# Patient Record
Sex: Female | Born: 1980 | Race: White | Hispanic: No | Marital: Married | Smoking: Current every day smoker
Health system: Southern US, Community
[De-identification: ages and names within clinical notes are randomized; demographics above are authoritative.]

## PROBLEM LIST (undated history)

## (undated) DIAGNOSIS — F32A Depression, unspecified: Secondary | ICD-10-CM

## (undated) DIAGNOSIS — T4145XA Adverse effect of unspecified anesthetic, initial encounter: Secondary | ICD-10-CM

## (undated) DIAGNOSIS — R011 Cardiac murmur, unspecified: Secondary | ICD-10-CM

## (undated) DIAGNOSIS — F329 Major depressive disorder, single episode, unspecified: Secondary | ICD-10-CM

## (undated) DIAGNOSIS — F419 Anxiety disorder, unspecified: Secondary | ICD-10-CM

## (undated) DIAGNOSIS — T8859XA Other complications of anesthesia, initial encounter: Secondary | ICD-10-CM

## (undated) HISTORY — PX: TUBAL LIGATION: SHX77

---

## 2003-02-06 ENCOUNTER — Other Ambulatory Visit: Admission: RE | Admit: 2003-02-06 | Discharge: 2003-02-06 | Payer: Self-pay | Admitting: Family Medicine

## 2003-10-01 ENCOUNTER — Emergency Department (HOSPITAL_COMMUNITY): Admission: EM | Admit: 2003-10-01 | Discharge: 2003-10-02 | Payer: Self-pay | Admitting: Emergency Medicine

## 2010-06-03 ENCOUNTER — Emergency Department (HOSPITAL_COMMUNITY): Payer: Self-pay

## 2010-06-03 ENCOUNTER — Emergency Department (HOSPITAL_COMMUNITY)
Admission: EM | Admit: 2010-06-03 | Discharge: 2010-06-03 | Disposition: A | Discharge status: Home / Self Care | Payer: Self-pay | Attending: Emergency Medicine | Admitting: Emergency Medicine

## 2010-06-03 DIAGNOSIS — R079 Chest pain, unspecified: Secondary | ICD-10-CM | POA: Insufficient documentation

## 2010-06-03 DIAGNOSIS — F411 Generalized anxiety disorder: Secondary | ICD-10-CM | POA: Insufficient documentation

## 2011-12-04 ENCOUNTER — Encounter (HOSPITAL_COMMUNITY): Payer: Self-pay | Admitting: *Deleted

## 2011-12-04 ENCOUNTER — Emergency Department (HOSPITAL_COMMUNITY)
Admission: EM | Admit: 2011-12-04 | Discharge: 2011-12-04 | Disposition: A | Discharge status: Home / Self Care | Payer: Medicaid Other | Attending: Emergency Medicine | Admitting: Emergency Medicine

## 2011-12-04 DIAGNOSIS — Z3202 Encounter for pregnancy test, result negative: Secondary | ICD-10-CM | POA: Insufficient documentation

## 2011-12-04 DIAGNOSIS — R112 Nausea with vomiting, unspecified: Secondary | ICD-10-CM | POA: Insufficient documentation

## 2011-12-04 DIAGNOSIS — Z8659 Personal history of other mental and behavioral disorders: Secondary | ICD-10-CM | POA: Insufficient documentation

## 2011-12-04 DIAGNOSIS — F17200 Nicotine dependence, unspecified, uncomplicated: Secondary | ICD-10-CM | POA: Insufficient documentation

## 2011-12-04 HISTORY — DX: Anxiety disorder, unspecified: F41.9

## 2011-12-04 HISTORY — DX: Major depressive disorder, single episode, unspecified: F32.9

## 2011-12-04 HISTORY — DX: Depression, unspecified: F32.A

## 2011-12-04 LAB — COMPREHENSIVE METABOLIC PANEL
ALT: 11 U/L (ref 0–35)
AST: 13 U/L (ref 0–37)
Albumin: 3.7 g/dL (ref 3.5–5.2)
Alkaline Phosphatase: 52 U/L (ref 39–117)
BUN: 4 mg/dL — ABNORMAL LOW (ref 6–23)
Chloride: 106 mEq/L (ref 96–112)
Creatinine, Ser: 0.5 mg/dL (ref 0.50–1.10)
GFR calc non Af Amer: >90 mL/min (ref >90)
Glucose, Bld: 93 mg/dL (ref 70–99)
Potassium: 3.8 mEq/L (ref 3.5–5.1)
Total Bilirubin: 0.1 mg/dL — ABNORMAL LOW (ref 0.3–1.2)
Total Protein: 6.7 g/dL (ref 6.0–8.3)

## 2011-12-04 LAB — GASTRIC OCCULT BLOOD (1-CARD TO LAB): pH, Gastric: 7

## 2011-12-04 LAB — URINALYSIS, ROUTINE W REFLEX MICROSCOPIC
Glucose, UA: NEGATIVE mg/dL
Ketones, ur: NEGATIVE mg/dL

## 2011-12-04 LAB — CBC WITH DIFFERENTIAL/PLATELET
Eosinophils Absolute: 0.1 10*3/uL (ref 0.0–0.7)
Hemoglobin: 13.5 g/dL (ref 12.0–15.0)
MCH: 31.1 pg (ref 26.0–34.0)
Monocytes Relative: 6 % (ref 3–12)
Neutrophils Relative %: 75 % (ref 43–77)
RDW: 13.6 % (ref 11.5–15.5)
WBC: 12.2 10*3/uL — ABNORMAL HIGH (ref 4.0–10.5)

## 2011-12-04 MED ORDER — HYDROCODONE-ACETAMINOPHEN 5-500 MG PO TABS: 1.0000 | ORAL_TABLET | Freq: Four times a day (QID) | ORAL | Status: DC | PRN

## 2011-12-04 MED ORDER — ONDANSETRON HCL 4 MG PO TABS: 4.0000 mg | ORAL_TABLET | Freq: Four times a day (QID) | ORAL | Status: DC

## 2011-12-04 MED ORDER — PANTOPRAZOLE SODIUM 40 MG IV SOLR
40.0000 mg | Freq: Once | INTRAVENOUS | Status: AC
  Administered 2011-12-04: 40 mg via INTRAVENOUS
  Filled 2011-12-04: qty 40

## 2011-12-04 MED ORDER — ONDANSETRON HCL 4 MG/2ML IJ SOLN
4.0000 mg | Freq: Once | INTRAMUSCULAR | Status: AC
  Administered 2011-12-04: 4 mg via INTRAVENOUS
  Filled 2011-12-04: qty 2

## 2011-12-04 MED ORDER — METOCLOPRAMIDE HCL 5 MG/ML IJ SOLN
10.0000 mg | Freq: Once | INTRAMUSCULAR | Status: AC
  Administered 2011-12-04: 10 mg via INTRAVENOUS
  Filled 2011-12-04: qty 2

## 2011-12-04 NOTE — ED Notes (Signed)
Pt has been feeling sick since Saturday night.  Pt reports epigastric pian and that she had dark, coffee ground emesis at that time.  Pt reports that she felt fine on Sunday.  This am she had one episode of dark coffee ground emesis this am.  Pt also had one episode of vomiting en route for ems, does not appear coffee ground looking to me.  Pt reports epigastric pain with this.  EKG wnl for ems today.

## 2011-12-04 NOTE — ED Provider Notes (Signed)
History     CSN: [id number]  Arrival date & time 12/04/11  1040   First MD Initiated Contact with Patient 12/04/11 1048      Chief Complaint  Patient presents with  . Hematemesis    (Consider location/radiation/quality/duration/timing/severity/associated sxs/prior treatment) HPI  Pt comes to the ER for evaluation of nausea and epigastric pain since Saturday night. She feels that she is having coffee ground emesis. She felt no symptoms on Sunday and then this morning she had another episode of vomiting as well as on the way to the ER. She denies having a history of gastric ulcers, excessive alcohol use, pancreatitis, GERD, or cardiac history. She is otherwise healthy, aside from anxiety symptoms. She is not actively vomiting at this time. Her most concerning symptom is the nausea. nad vss  Past Medical History  Diagnosis Date  . Depression   . Anxiety     Past Surgical History  Procedure Date  . Tubal ligation     No family history on file.  History  Substance Use Topics  . Smoking status: Current Every Day Smoker -- 0.5 packs/day    Types: Cigarettes  . Smokeless tobacco: Not on file  . Alcohol Use: Yes    OB History    Grav Para Term Preterm Abortions TAB SAB Ect Mult Living                  Review of Systems    Review of Systems  Gen: no weight loss, fevers, chills, night sweats  Neck: no neck pain  Lungs:No wheezing, coughing or hemoptysis CV: no chest pain, palpitations, dependent edema or orthopnea  Abd: + abdominal pain - epigastric, +nausea, + vomiting  GU: no dysuria or gross hematuria  MSK:  No abnormalities  Neuro: no headache, no focal neurologic deficits  Skin: no abnormalities Psyche: negative.   Allergies  Paxil  Home Medications   Current Outpatient Rx  Name  Route  Sig  Dispense  Refill  . IBUPROFEN 200 MG PO TABS   Oral   Take 600 mg by mouth every 6 (six) hours as needed. For pain         . HYDROCODONE-ACETAMINOPHEN 5-500  MG PO TABS   Oral   Take 1-2 tablets by mouth every 6 (six) hours as needed for pain.   15 tablet   0   . ONDANSETRON HCL 4 MG PO TABS   Oral   Take 1 tablet (4 mg total) by mouth every 6 (six) hours.   12 tablet   0     BP 94/66  Pulse 67  Temp 98.1 F (36.7 C) (Oral)  Resp 16  SpO2 100%  LMP 11/27/2011  Physical Exam  Nursing note and vitals reviewed. Constitutional: She appears well-developed and well-nourished. No distress.  HENT:  Head: Normocephalic and atraumatic.  Eyes: Pupils are equal, round, and reactive to light.  Neck: Normal range of motion. Neck supple.  Cardiovascular: Normal rate and regular rhythm.   Pulmonary/Chest: Effort normal. No respiratory distress. She has no wheezes. She has no rales.  Abdominal: Soft. She exhibits no distension. There is tenderness (mild epigastric tenderness). There is no rebound and no guarding.  Neurological: She is alert.  Skin: Skin is warm and dry.    ED Course  Procedures (including critical care time)  Labs Reviewed  COMPREHENSIVE METABOLIC PANEL - Abnormal; Notable for the following:    BUN 4 (*)     Total Bilirubin 0.1 (*)  All other components within normal limits  CBC WITH DIFFERENTIAL - Abnormal; Notable for the following:    WBC 12.2 (*)     Neutro Abs 9.2 (*)     All other components within normal limits  URINALYSIS, ROUTINE W REFLEX MICROSCOPIC - Abnormal; Notable for the following:    APPearance CLOUDY (*)     Bilirubin Urine SMALL (*)     All other components within normal limits  LIPASE, BLOOD  POCT PREGNANCY, URINE  POCT GASTRIC OCCULT BLOOD   No results found.   1. Nausea and vomiting       MDM   Date: 12/04/2011  Rate: 66  Rhythm: normal sinus rhythm  QRS Axis: normal  Intervals: normal  ST/T Wave abnormalities: normal  Conduction Disutrbances:none  Narrative Interpretation:   Old EKG Reviewed: unchanged  Gastric hemoccult  negative. Pregnancy test negative. Labs are  reassuring.   Pt given Zofran and Reglan. She has had complete resolution of nausea and cramping. Epigastric cramping is only present right before  Vomiting. Will rx zofran and vicodin. Gastro referral given.   Pt has been advised of the symptoms that warrant their return to the ED. Patient has voiced understanding and has agreed to follow-up with the PCP or specialist.          Dorthula Matas, PA 12/04/11 1238

## 2011-12-04 NOTE — ED Provider Notes (Signed)
Medical screening examination/treatment/procedure(s) were performed by non-physician practitioner and as supervising physician I was immediately available for consultation/collaboration.   Gwyneth Sprout, MD 12/04/11 [phone]

## 2011-12-04 NOTE — ED Notes (Signed)
ONG:[id number]<WU> Expected date:<BR> Expected time:<BR> Means of arrival:<BR> Comments:<BR> Vomiting blood

## 2011-12-15 ENCOUNTER — Encounter (HOSPITAL_BASED_OUTPATIENT_CLINIC_OR_DEPARTMENT_OTHER): Payer: Self-pay

## 2011-12-15 ENCOUNTER — Emergency Department (HOSPITAL_BASED_OUTPATIENT_CLINIC_OR_DEPARTMENT_OTHER)
Admission: EM | Admit: 2011-12-15 | Discharge: 2011-12-15 | Disposition: A | Discharge status: Home / Self Care | Payer: Medicaid Other | Attending: Emergency Medicine | Admitting: Emergency Medicine

## 2011-12-15 DIAGNOSIS — K2970 Gastritis, unspecified, without bleeding: Secondary | ICD-10-CM | POA: Insufficient documentation

## 2011-12-15 DIAGNOSIS — K2990 Gastroduodenitis, unspecified, without bleeding: Secondary | ICD-10-CM | POA: Insufficient documentation

## 2011-12-15 DIAGNOSIS — R079 Chest pain, unspecified: Secondary | ICD-10-CM | POA: Insufficient documentation

## 2011-12-15 DIAGNOSIS — Z8659 Personal history of other mental and behavioral disorders: Secondary | ICD-10-CM | POA: Insufficient documentation

## 2011-12-15 DIAGNOSIS — Z3202 Encounter for pregnancy test, result negative: Secondary | ICD-10-CM | POA: Insufficient documentation

## 2011-12-15 DIAGNOSIS — R630 Anorexia: Secondary | ICD-10-CM | POA: Insufficient documentation

## 2011-12-15 DIAGNOSIS — R112 Nausea with vomiting, unspecified: Secondary | ICD-10-CM | POA: Insufficient documentation

## 2011-12-15 DIAGNOSIS — R5381 Other malaise: Secondary | ICD-10-CM | POA: Insufficient documentation

## 2011-12-15 DIAGNOSIS — F17200 Nicotine dependence, unspecified, uncomplicated: Secondary | ICD-10-CM | POA: Insufficient documentation

## 2011-12-15 LAB — COMPREHENSIVE METABOLIC PANEL
ALT: 16 U/L (ref 0–35)
AST: 15 U/L (ref 0–37)
Albumin: 3.9 g/dL (ref 3.5–5.2)
CO2: 23 mEq/L (ref 19–32)
Calcium: 9.4 mg/dL (ref 8.4–10.5)
Creatinine, Ser: 0.6 mg/dL (ref 0.50–1.10)
GFR calc non Af Amer: >90 mL/min (ref >90)
Sodium: 139 mEq/L (ref 135–145)

## 2011-12-15 LAB — URINALYSIS, ROUTINE W REFLEX MICROSCOPIC
Bilirubin Urine: NEGATIVE
Glucose, UA: NEGATIVE mg/dL
Hgb urine dipstick: NEGATIVE
Nitrite: NEGATIVE
Protein, ur: NEGATIVE mg/dL
Specific Gravity, Urine: 1.027 (ref 1.005–1.030)
Urobilinogen, UA: 0.2 mg/dL (ref 0.0–1.0)
pH: 5 (ref 5.0–8.0)

## 2011-12-15 LAB — CBC
MCH: 31 pg (ref 26.0–34.0)
MCV: 87.8 fL (ref 78.0–100.0)
Platelets: 290 10*3/uL (ref 150–400)
RBC: 4.42 MIL/uL (ref 3.87–5.11)
RDW: 13.2 % (ref 11.5–15.5)
WBC: 12.6 10*3/uL — ABNORMAL HIGH (ref 4.0–10.5)

## 2011-12-15 LAB — RAPID URINE DRUG SCREEN, HOSP PERFORMED
Amphetamines: NOT DETECTED
Barbiturates: NOT DETECTED
Benzodiazepines: POSITIVE — AB
Cocaine: NOT DETECTED
Opiates: NOT DETECTED
Tetrahydrocannabinol: POSITIVE — AB

## 2011-12-15 MED ORDER — OMEPRAZOLE 20 MG PO CPDR: 20.0000 mg | DELAYED_RELEASE_CAPSULE | Freq: Two times a day (BID) | ORAL | Status: DC

## 2011-12-15 MED ORDER — SUCRALFATE 1 GM/10ML PO SUSP: 1.0000 g | Freq: Four times a day (QID) | ORAL | Status: DC

## 2011-12-15 MED ORDER — SODIUM CHLORIDE 0.9 % IV BOLUS (SEPSIS)
1000.0000 mL | Freq: Once | INTRAVENOUS | Status: AC
  Administered 2011-12-15: 1000 mL via INTRAVENOUS

## 2011-12-15 MED ORDER — METOCLOPRAMIDE HCL 5 MG/ML IJ SOLN
10.0000 mg | Freq: Once | INTRAMUSCULAR | Status: AC
  Administered 2011-12-15: 10 mg via INTRAVENOUS
  Filled 2011-12-15: qty 2

## 2011-12-15 NOTE — ED Notes (Signed)
Patient reports that she developed chestpain 2 weeks ago with vomiting intermittently and ongoing epigastric pain. Was seen at [facility] ED for same and no diagnosis made. Reports diarrhea intermittently for months as well.

## 2011-12-15 NOTE — ED Notes (Signed)
Pt reminded of need for urine sample. Pt sts she cannot go at this time.

## 2011-12-15 NOTE — ED Provider Notes (Signed)
History     CSN: [id number]  Arrival date & time 12/15/11  0102   First MD Initiated Contact with Patient 12/15/11 0115      Chief Complaint  Patient presents with  . Abdominal Pain    (Consider location/radiation/quality/duration/timing/severity/associated sxs/prior treatment) HPI Comments: Cynthia Russell presents for evaluation of abdominal and chest pain.  Cynthia Russell was seen in the ER on 11/18 with similar discomfort.  Cynthia Russell reports the symptoms improved for about 2 days but have returned.  Cynthia Russell states Cynthia Russell has intermittent mid and upper abdominal pain associated with nonbloody, nonbilious emesis.  Cynthia Russell denies any fever, ST, neck stiffness, SOB, cough, palpitations, melena, and hematochezia.  Cynthia Russell denies trauma also.  Patient is a 31 y.o. female presenting with abdominal pain. The history is provided by the patient.  Abdominal Pain The primary symptoms of the illness include abdominal pain, fatigue, nausea and vomiting. The primary symptoms of the illness do not include fever, shortness of breath, diarrhea, hematemesis, hematochezia, dysuria or vaginal discharge. The current episode started more than 2 days ago. The onset of the illness was gradual. The problem has been gradually worsening.  The patient states that Cynthia Russell believes Cynthia Russell is currently not pregnant. The patient has not had a change in bowel habit. Additional symptoms associated with the illness include anorexia. Symptoms associated with the illness do not include chills, diaphoresis, heartburn, constipation, urgency, hematuria, frequency or back pain.    Past Medical History  Diagnosis Date  . Depression   . Anxiety     Past Surgical History  Procedure Date  . Tubal ligation     No family history on file.  History  Substance Use Topics  . Smoking status: Current Every Day Smoker -- 0.5 packs/day    Types: Cigarettes  . Smokeless tobacco: Not on file  . Alcohol Use: Yes    OB History    Grav Para Term Preterm Abortions TAB  SAB Ect Mult Living                  Review of Systems  Constitutional: Positive for appetite change and fatigue. Negative for fever, chills, diaphoresis and activity change.  HENT: Negative.   Eyes: Negative.   Respiratory: Negative for cough, chest tightness and shortness of breath.   Cardiovascular: Positive for chest pain. Negative for palpitations and leg swelling.  Gastrointestinal: Positive for nausea, vomiting, abdominal pain and anorexia. Negative for heartburn, diarrhea, constipation, blood in stool, hematochezia, rectal pain and hematemesis.  Genitourinary: Negative for dysuria, urgency, frequency, hematuria, flank pain, vaginal discharge, difficulty urinating and pelvic pain.  Musculoskeletal: Negative for myalgias, back pain and joint swelling.  Skin: Negative.   Neurological: Negative.   Hematological: Negative.   Psychiatric/Behavioral: Negative.     Allergies  Paxil  Home Medications   Current Outpatient Rx  Name  Route  Sig  Dispense  Refill  . HYDROCODONE-ACETAMINOPHEN 5-500 MG PO TABS   Oral   Take 1-2 tablets by mouth every 6 (six) hours as needed for pain.   15 tablet   0   . IBUPROFEN 200 MG PO TABS   Oral   Take 600 mg by mouth every 6 (six) hours as needed. For pain         . ONDANSETRON HCL 4 MG PO TABS   Oral   Take 1 tablet (4 mg total) by mouth every 6 (six) hours.   12 tablet   0     LMP 11/27/2011  Physical Exam  Nursing note and vitals reviewed. Constitutional: Cynthia Russell is oriented to person, place, and time. Cynthia Russell appears well-developed and well-nourished. No distress. Cynthia Russell is not intubated.  HENT:  Head: Normocephalic and atraumatic.  Right Ear: External ear normal.  Left Ear: External ear normal.  Nose: Nose normal.  Mouth/Throat: Oropharynx is clear and moist. No oropharyngeal exudate.  Eyes: Conjunctivae normal are normal. Pupils are equal, round, and reactive to light. Right eye exhibits no discharge. Left eye exhibits no  discharge. No scleral icterus.  Neck: Normal range of motion. Neck supple. No JVD present. No tracheal deviation present. No thyromegaly present.  Cardiovascular: Normal rate, regular rhythm, normal heart sounds and intact distal pulses.  Exam reveals no gallop and no friction rub.   No murmur heard. Pulmonary/Chest: Effort normal and breath sounds normal. No accessory muscle usage or stridor. Not tachypneic and not bradypneic. Cynthia Russell is not intubated. No respiratory distress. Cynthia Russell has no decreased breath sounds. Cynthia Russell has no wheezes. Cynthia Russell has no rhonchi. Cynthia Russell has no rales. Cynthia Russell exhibits no tenderness.  Abdominal: Soft. Normal appearance and bowel sounds are normal. Cynthia Russell exhibits no distension, no ascites, no pulsatile midline mass and no mass. There is no hepatosplenomegaly. There is tenderness in the epigastric area. There is no rebound, no guarding and no CVA tenderness. No hernia.  Musculoskeletal: Normal range of motion. Cynthia Russell exhibits no edema and no tenderness.  Lymphadenopathy:    Cynthia Russell has no cervical adenopathy.  Neurological: Cynthia Russell is alert and oriented to person, place, and time. No cranial nerve deficit.  Skin: Skin is warm and dry. No rash noted. Cynthia Russell is not diaphoretic. No erythema. No pallor.  Psychiatric: Cynthia Russell has a normal mood and affect. Cynthia Russell behavior is normal.    ED Course  Procedures (including critical care time)   Labs Reviewed  URINALYSIS, ROUTINE W REFLEX MICROSCOPIC  PREGNANCY, URINE  CBC  COMPREHENSIVE METABOLIC PANEL  LIPASE, BLOOD  URINE RAPID DRUG SCREEN (HOSP PERFORMED)  TROPONIN I  TROPONIN I   No results found.   No diagnosis found.   Date: 12/15/2011  Rate: 86 bpm  Rhythm: sinus  QRS Axis: normal  Intervals: normal  ST/T Wave abnormalities: nonspecific T wave changes  Conduction Disutrbances:none  Narrative Interpretation:   Old EKG Reviewed: unchanged      MDM  Pt presents for evaluation of persistent upper abdominal and chest pain associated with  vomiting.  Cynthia Russell appears uncomfortable, note borderline low BP, NAD.  Plan belly labs, EKG, trop x2.  Will provide symptomatic care while awaiting results.  [id number].  Pt stable, NAD.  Cynthia Russell has been resting comfortably since arrival.  Cynthia Russell has no evidence of a surgical abdomen.  Trop is negative x2 and Cynthia Russell denies risk factors for early CAD.  Note normal LFTs, BMP, and lipase.  Plan symptomatic care and outpt follow-up with GI.      Tobin Chad, MD 12/15/11 [phone]

## 2011-12-15 NOTE — ED Notes (Signed)
Pt reminded of need for urine sample.  

## 2012-01-17 ENCOUNTER — Encounter (HOSPITAL_COMMUNITY): Payer: Self-pay

## 2012-01-17 ENCOUNTER — Emergency Department (HOSPITAL_COMMUNITY): Payer: Medicaid Other

## 2012-01-17 ENCOUNTER — Emergency Department (HOSPITAL_COMMUNITY)
Admission: EM | Admit: 2012-01-17 | Discharge: 2012-01-17 | Disposition: A | Discharge status: Home / Self Care | Payer: Medicaid Other | Attending: Emergency Medicine | Admitting: Emergency Medicine

## 2012-01-17 DIAGNOSIS — F17200 Nicotine dependence, unspecified, uncomplicated: Secondary | ICD-10-CM | POA: Insufficient documentation

## 2012-01-17 DIAGNOSIS — R109 Unspecified abdominal pain: Secondary | ICD-10-CM | POA: Insufficient documentation

## 2012-01-17 DIAGNOSIS — K8020 Calculus of gallbladder without cholecystitis without obstruction: Secondary | ICD-10-CM | POA: Insufficient documentation

## 2012-01-17 DIAGNOSIS — Z8659 Personal history of other mental and behavioral disorders: Secondary | ICD-10-CM | POA: Insufficient documentation

## 2012-01-17 LAB — URINALYSIS, ROUTINE W REFLEX MICROSCOPIC
Glucose, UA: NEGATIVE mg/dL
Leukocytes, UA: NEGATIVE
Protein, ur: NEGATIVE mg/dL
Specific Gravity, Urine: 1.025 (ref 1.005–1.030)
Urobilinogen, UA: 0.2 mg/dL (ref 0.0–1.0)

## 2012-01-17 LAB — CBC WITH DIFFERENTIAL/PLATELET
Basophils Absolute: 0 10*3/uL (ref 0.0–0.1)
Basophils Relative: 0 % (ref 0–1)
Eosinophils Absolute: 0.1 10*3/uL (ref 0.0–0.7)
Eosinophils Relative: 1 % (ref 0–5)
HCT: 44.6 % (ref 36.0–46.0)
Lymphocytes Relative: 22 % (ref 12–46)
MCH: 31.6 pg (ref 26.0–34.0)
MCHC: 34.8 g/dL (ref 30.0–36.0)
MCV: 91 fL (ref 78.0–100.0)
Monocytes Absolute: 0.7 10*3/uL (ref 0.1–1.0)
Platelets: 329 10*3/uL (ref 150–400)
RDW: 13.5 % (ref 11.5–15.5)

## 2012-01-17 LAB — COMPREHENSIVE METABOLIC PANEL
ALT: 16 U/L (ref 0–35)
AST: 13 U/L (ref 0–37)
CO2: 25 mEq/L (ref 19–32)
Calcium: 9.8 mg/dL (ref 8.4–10.5)
Creatinine, Ser: 0.57 mg/dL (ref 0.50–1.10)
GFR calc non Af Amer: >90 mL/min (ref >90)
Sodium: 137 mEq/L (ref 135–145)
Total Protein: 8.1 g/dL (ref 6.0–8.3)

## 2012-01-17 MED ORDER — SODIUM CHLORIDE 0.9 % IV SOLN: INTRAVENOUS | Status: DC

## 2012-01-17 MED ORDER — HYDROMORPHONE HCL PF 1 MG/ML IJ SOLN
1.0000 mg | Freq: Once | INTRAMUSCULAR | Status: AC
  Administered 2012-01-17: 1 mg via INTRAVENOUS
  Filled 2012-01-17: qty 1

## 2012-01-17 MED ORDER — ONDANSETRON 8 MG PO TBDP
ORAL_TABLET | ORAL | Status: AC
  Administered 2012-01-17: 8 mg via ORAL
  Filled 2012-01-17: qty 1

## 2012-01-17 MED ORDER — IOHEXOL 300 MG/ML  SOLN
100.0000 mL | Freq: Once | INTRAMUSCULAR | Status: AC | PRN
  Administered 2012-01-17: 100 mL via INTRAVENOUS

## 2012-01-17 MED ORDER — PANTOPRAZOLE SODIUM 40 MG IV SOLR
40.0000 mg | Freq: Once | INTRAVENOUS | Status: AC
  Administered 2012-01-17: 40 mg via INTRAVENOUS
  Filled 2012-01-17: qty 40

## 2012-01-17 MED ORDER — HYDROCODONE-ACETAMINOPHEN 5-325 MG PO TABS: 1.0000 | ORAL_TABLET | Freq: Four times a day (QID) | ORAL | Status: DC | PRN

## 2012-01-17 MED ORDER — SODIUM CHLORIDE 0.9 % IV BOLUS (SEPSIS)
250.0000 mL | Freq: Once | INTRAVENOUS | Status: AC
  Administered 2012-01-17: 250 mL via INTRAVENOUS

## 2012-01-17 MED ORDER — ONDANSETRON 8 MG PO TBDP
8.0000 mg | ORAL_TABLET | Freq: Once | ORAL | Status: AC
  Administered 2012-01-17: 8 mg via ORAL

## 2012-01-17 MED ORDER — ONDANSETRON HCL 4 MG/2ML IJ SOLN
4.0000 mg | Freq: Once | INTRAMUSCULAR | Status: AC
  Administered 2012-01-17: 4 mg via INTRAVENOUS
  Filled 2012-01-17: qty 2

## 2012-01-17 MED ORDER — ONDANSETRON HCL 4 MG/2ML IJ SOLN: 4.0000 mg | Freq: Once | INTRAMUSCULAR | Status: AC

## 2012-01-17 NOTE — ED Provider Notes (Addendum)
History   This chart was scribed for Shelda Jakes, MD, by Frederik Pear, ER scribe. The patient was seen in room APA08/APA08 and the patient's care was started at 1203.    CSN: [id number]  Arrival date & time 01/17/12  1041   First MD Initiated Contact with Patient 01/17/12 1203      Chief Complaint  Patient presents with  . Emesis  . Abdominal Pain    (Consider location/radiation/quality/duration/timing/severity/associated sxs/prior treatment) HPI  KATHLEEN TAMM is a 32 y.o. female who presents to the Emergency Department complaining of intermittent, moderate, gradually worsening epigastric abdominal pain that last 6-8 hours at a time is not improved by anything with associated intermittent nausea, fatigue, and emesis. She reports that the current pain began at 07:00, and the most recent episode was yesterday at 07:00, but the overall pain began a month and a half ago. She states that she has been to the ED 2 previous times for the same symptoms. She states that has tried non-steroids and acid reducing medication with no relief. She denies any congestion or coughing. She states that she is waiting to get insurance on 01/15 to see a GI specialist.   Past Medical History  Diagnosis Date  . Depression   . Anxiety     Past Surgical History  Procedure Date  . Tubal ligation     No family history on file.  History  Substance Use Topics  . Smoking status: Current Every Day Smoker -- 0.5 packs/day    Types: Cigarettes  . Smokeless tobacco: Not on file  . Alcohol Use: Yes    OB History    Grav Para Term Preterm Abortions TAB SAB Ect Mult Living                  Review of Systems  Constitutional: Negative for fever.  HENT: Negative for congestion.   Eyes: Negative for redness.  Respiratory: Negative for cough.   Gastrointestinal: Positive for nausea, vomiting and abdominal pain. Negative for diarrhea.  Genitourinary: Negative for dysuria.  Musculoskeletal:  Negative for back pain.  Skin: Negative for rash.  Neurological: Negative for headaches.  Hematological: Does not bruise/bleed easily.  All other systems reviewed and are negative.    Allergies  Paxil  Home Medications   Current Outpatient Rx  Name  Route  Sig  Dispense  Refill  . IBUPROFEN 200 MG PO TABS   Oral   Take 600 mg by mouth every 6 (six) hours as needed. For pain         . PROMETHAZINE HCL 25 MG PO TABS   Oral   Take 25 mg by mouth every 6 (six) hours as needed. Nausea/vomiting.         Marland Kitchen HYDROCODONE-ACETAMINOPHEN 5-325 MG PO TABS   Oral   Take 1 tablet by mouth every 6 (six) hours as needed for pain.   15 tablet   0     BP 121/84  Pulse 62  Temp 98 F (36.7 C) (Oral)  Resp 18  Ht 5' (1.524 m)  Wt 115 lb (52.164 kg)  BMI 22.46 kg/m2  SpO2 99%  LMP 01/10/2012  Physical Exam  Nursing note and vitals reviewed. Constitutional: She is oriented to person, place, and time.  Neck: Neck supple.  Pulmonary/Chest: Effort normal and breath sounds normal. No respiratory distress.  Abdominal: Soft. Bowel sounds are normal. She exhibits no mass. There is tenderness. There is no rebound and no guarding.  She has epigastric tenderness.  Musculoskeletal: Normal range of motion. She exhibits no edema.  Neurological: She is alert and oriented to person, place, and time. No cranial nerve deficit. Coordination normal.  Skin: Skin is warm and dry.  Psychiatric: She has a normal mood and affect. Thought content normal.    ED Course  Procedures (including critical care time)  DIAGNOSTIC STUDIES: Oxygen Saturation is 100% on room air, normal by my interpretation.    COORDINATION OF CARE:  13:15- Discussed planned course of treatment with the patient, who is agreeable at this time.  Results for orders placed during the hospital encounter of 01/17/12  CBC WITH DIFFERENTIAL      Component Value Range   WBC 10.3  4.0 - 10.5 K/uL   RBC 4.90  3.87 - 5.11  MIL/uL   Hemoglobin 15.5 (*) 12.0 - 15.0 g/dL   HCT 16.1  09.6 - 04.5 %   MCV 91.0  78.0 - 100.0 fL   MCH 31.6  26.0 - 34.0 pg   MCHC 34.8  30.0 - 36.0 g/dL   RDW 40.9  81.1 - 91.4 %   Platelets 329  150 - 400 K/uL   Neutrophils Relative 70  43 - 77 %   Neutro Abs 7.3  1.7 - 7.7 K/uL   Lymphocytes Relative 22  12 - 46 %   Lymphs Abs 2.2  0.7 - 4.0 K/uL   Monocytes Relative 7  3 - 12 %   Monocytes Absolute 0.7  0.1 - 1.0 K/uL   Eosinophils Relative 1  0 - 5 %   Eosinophils Absolute 0.1  0.0 - 0.7 K/uL   Basophils Relative 0  0 - 1 %   Basophils Absolute 0.0  0.0 - 0.1 K/uL  COMPREHENSIVE METABOLIC PANEL      Component Value Range   Sodium 137  135 - 145 mEq/L   Potassium 3.6  3.5 - 5.1 mEq/L   Chloride 101  96 - 112 mEq/L   CO2 25  19 - 32 mEq/L   Glucose, Bld 95  70 - 99 mg/dL   BUN 5 (*) 6 - 23 mg/dL   Creatinine, Ser 7.82  0.50 - 1.10 mg/dL   Calcium 9.8  8.4 - 95.6 mg/dL   Total Protein 8.1  6.0 - 8.3 g/dL   Albumin 4.5  3.5 - 5.2 g/dL   AST 13  0 - 37 U/L   ALT 16  0 - 35 U/L   Alkaline Phosphatase 64  39 - 117 U/L   Total Bilirubin 0.2 (*) 0.3 - 1.2 mg/dL   GFR calc non Af Amer >90  >90 mL/min   GFR calc Af Amer >90  >90 mL/min  LIPASE, BLOOD      Component Value Range   Lipase 33  11 - 59 U/L  URINALYSIS, ROUTINE W REFLEX MICROSCOPIC      Component Value Range   Color, Urine YELLOW  YELLOW   APPearance HAZY (*) CLEAR   Specific Gravity, Urine 1.025  1.005 - 1.030   pH 6.0  5.0 - 8.0   Glucose, UA NEGATIVE  NEGATIVE mg/dL   Hgb urine dipstick NEGATIVE  NEGATIVE   Bilirubin Urine NEGATIVE  NEGATIVE   Ketones, ur NEGATIVE  NEGATIVE mg/dL   Protein, ur NEGATIVE  NEGATIVE mg/dL   Urobilinogen, UA 0.2  0.0 - 1.0 mg/dL   Nitrite NEGATIVE  NEGATIVE   Leukocytes, UA NEGATIVE  NEGATIVE     Labs Reviewed  CBC WITH DIFFERENTIAL - Abnormal; Notable for the following:    Hemoglobin 15.5 (*)     All other components within normal limits  COMPREHENSIVE METABOLIC  PANEL - Abnormal; Notable for the following:    BUN 5 (*)     Total Bilirubin 0.2 (*)     All other components within normal limits  URINALYSIS, ROUTINE W REFLEX MICROSCOPIC - Abnormal; Notable for the following:    APPearance HAZY (*)     All other components within normal limits  LIPASE, BLOOD   Ct Abdomen Pelvis W Contrast  01/17/2012  *RADIOLOGY REPORT*  Clinical Data: abdominal pain  CT ABDOMEN AND PELVIS WITH CONTRAST  Technique:  Multidetector CT imaging of the abdomen and pelvis was performed following the standard protocol during bolus administration of intravenous contrast.  Contrast: OMNIPAQUE IOHEXOL 300 MG/ML  SOLN  Comparison: None.  Findings:  Lung bases:  No pericardial or pleural effusions.  Nonspecific ground-glass attenuation noted within the bilateral posterior lower lobes.  No airspace consolidation.  Abdomen/pelvis: Stones are identified within the lumen of the gallbladder.  The largest is in the gallbladder neck measuring 1.5 cm, image 22.  No suspicious liver abnormality. No biliary dilatation.  The pancreas is normal.  Normal appearance of the spleen.  The adrenal glands are both negative.  Right kidney normal.  The left kidney is normal.  Urinary bladder is unremarkable.  Uterus and adnexal structures have a normal physiologic appearance.  The abdominal aorta is normal in caliber.  No adenopathy identified within the abdomen or the pelvis.  No inguinal adenopathy.  No free fluid or fluid collections noted.  The stomach and the small bowel loops are unremarkable.  The appendix is visualized and appears normal.  Normal appearance of the colon.  Bones/Musculoskeletal:  Review of the visualized osseous structures is unremarkable.  IMPRESSION:  1.  No acute findings identified within the abdomen or pelvis. 2.  Gallstones. 3.  Nonspecific ground-glass attenuation within both lower lobes likely related to pneumonitis or dependent change.   Original Report Authenticated By: Signa Kell, M.D.    Results for orders placed during the hospital encounter of 01/17/12  CBC WITH DIFFERENTIAL      Component Value Range   WBC 10.3  4.0 - 10.5 K/uL   RBC 4.90  3.87 - 5.11 MIL/uL   Hemoglobin 15.5 (*) 12.0 - 15.0 g/dL   HCT 78.2  95.6 - 21.3 %   MCV 91.0  78.0 - 100.0 fL   MCH 31.6  26.0 - 34.0 pg   MCHC 34.8  30.0 - 36.0 g/dL   RDW 08.6  57.8 - 46.9 %   Platelets 329  150 - 400 K/uL   Neutrophils Relative 70  43 - 77 %   Neutro Abs 7.3  1.7 - 7.7 K/uL   Lymphocytes Relative 22  12 - 46 %   Lymphs Abs 2.2  0.7 - 4.0 K/uL   Monocytes Relative 7  3 - 12 %   Monocytes Absolute 0.7  0.1 - 1.0 K/uL   Eosinophils Relative 1  0 - 5 %   Eosinophils Absolute 0.1  0.0 - 0.7 K/uL   Basophils Relative 0  0 - 1 %   Basophils Absolute 0.0  0.0 - 0.1 K/uL  COMPREHENSIVE METABOLIC PANEL      Component Value Range   Sodium 137  135 - 145 mEq/L   Potassium 3.6  3.5 - 5.1 mEq/L   Chloride 101  96 - 112 mEq/L   CO2 25  19 - 32 mEq/L   Glucose, Bld 95  70 - 99 mg/dL   BUN 5 (*) 6 - 23 mg/dL   Creatinine, Ser 4.09  0.50 - 1.10 mg/dL   Calcium 9.8  8.4 - 81.1 mg/dL   Total Protein 8.1  6.0 - 8.3 g/dL   Albumin 4.5  3.5 - 5.2 g/dL   AST 13  0 - 37 U/L   ALT 16  0 - 35 U/L   Alkaline Phosphatase 64  39 - 117 U/L   Total Bilirubin 0.2 (*) 0.3 - 1.2 mg/dL   GFR calc non Af Amer >90  >90 mL/min   GFR calc Af Amer >90  >90 mL/min  LIPASE, BLOOD      Component Value Range   Lipase 33  11 - 59 U/L  URINALYSIS, ROUTINE W REFLEX MICROSCOPIC      Component Value Range   Color, Urine YELLOW  YELLOW   APPearance HAZY (*) CLEAR   Specific Gravity, Urine 1.025  1.005 - 1.030   pH 6.0  5.0 - 8.0   Glucose, UA NEGATIVE  NEGATIVE mg/dL   Hgb urine dipstick NEGATIVE  NEGATIVE   Bilirubin Urine NEGATIVE  NEGATIVE   Ketones, ur NEGATIVE  NEGATIVE mg/dL   Protein, ur NEGATIVE  NEGATIVE mg/dL   Urobilinogen, UA 0.2  0.0 - 1.0 mg/dL   Nitrite NEGATIVE  NEGATIVE   Leukocytes, UA NEGATIVE   NEGATIVE     1. Cholelithiasis       MDM  CT findings consistent with gallstone no evidence of cholecystitis. Patient's liver function test are normal lipase is normal no significant leukocytosis. Patient's been having upper quadrant abdominal pain episodic "" crampy in nature since November without specific findings to explain the pain. Today's CT  confirms that this is most likely been biliary colic. Patient's abdomen and the right upper quadrant has no significant tenderness patient's discomfort is down to a 2/10 states that chest soreness. She feels that she can go home. Given a referral to general surgery Dr. Lovell Sheehan for consultation for gallbladder removal.  Patient given precautions on to return and signs and concerns for acute cholecystitis.     I personally performed the services described in this documentation, which was scribed in my presence. The recorded information has been reviewed and is accurate.         Shelda Jakes, MD 01/17/12 1650  Shelda Jakes, MD 01/17/12 [phone]

## 2012-01-17 NOTE — ED Notes (Signed)
Pt reports has had episodes of abd pain, chest pain, n/v off and on for the past  1 1/2 months.  Reports symptoms usually last 6-8 hours.  Reports symptoms began this morning around 0700.  PT says doesn't have insurance so she hasn't seen her doctor.

## 2012-01-25 ENCOUNTER — Encounter (HOSPITAL_COMMUNITY): Payer: Self-pay | Admitting: Pharmacy Technician

## 2012-01-25 NOTE — Patient Instructions (Addendum)
Cynthia Russell  01/25/2012   Your procedure is scheduled on:  01/31/2012  Report to [facility] at  915 AM.  Call this number if you have problems the morning of surgery: [phone]   Remember:   Do not eat food or drink liquids after midnight.   Take these medicines the morning of surgery with A SIP OF WATER: hydrocodone,phenergan   Do not wear jewelry, make-up or nail polish.  Do not wear lotions, powders, or perfumes.   Do not shave 48 hours prior to surgery. Men may shave face and neck.  Do not bring valuables to the hospital.  Contacts, dentures or bridgework may not be worn into surgery.  Leave suitcase in the car. After surgery it may be brought to your room.  For patients admitted to the hospital, checkout time is 11:00 AM the day of discharge.   Patients discharged the day of surgery will not be allowed to drive home.  Name and phone number of your driver: family  Special Instructions: Shower using CHG 2 nights before surgery and the night before surgery.  If you shower the day of surgery use CHG.  Use special wash - you have one bottle of CHG for all showers.  You should use approximately 1/3 of the bottle for each shower.   Please read over the following fact sheets that you were given: Pain Booklet, Coughing and Deep Breathing, MRSA Information, Surgical Site Infection Prevention, Anesthesia Post-op Instructions and Care and Recovery After Surgery Laparoscopic Cholecystectomy Laparoscopic cholecystectomy is surgery to remove the gallbladder. The gallbladder is located slightly to the right of center in the abdomen, behind the liver. It is a concentrating and storage sac for the bile produced in the liver. Bile aids in the digestion and absorption of fats. Gallbladder disease (cholecystitis) is an inflammation of your gallbladder. This condition is usually caused by a buildup of gallstones (cholelithiasis) in your gallbladder. Gallstones can block the flow of  bile, resulting in inflammation and pain. In severe cases, emergency surgery may be required. When emergency surgery is not required, you will have time to prepare for the procedure. Laparoscopic surgery is an alternative to open surgery. Laparoscopic surgery usually has a shorter recovery time. Your common bile duct may also need to be examined and explored. Your caregiver will discuss this with you if he or she feels this should be done. If stones are found in the common bile duct, they may be removed. LET YOUR CAREGIVER KNOW ABOUT:  Allergies to food or medicine.  Medicines taken, including vitamins, herbs, eyedrops, over-the-counter medicines, and creams.  Use of steroids (by mouth or creams).  Previous problems with anesthetics or numbing medicines.  History of bleeding problems or blood clots.  Previous surgery.  Other health problems, including diabetes and kidney problems.  Possibility of pregnancy, if this applies. RISKS AND COMPLICATIONS All surgery is associated with risks. Some problems that may occur following this procedure include:  Infection.  Damage to the common bile duct, nerves, arteries, veins, or other internal organs such as the stomach or intestines.  Bleeding.  A stone may remain in the common bile duct. BEFORE THE PROCEDURE  Do not take aspirin for 3 days prior to surgery or blood thinners for 1 week prior to surgery.  Do not eat or drink anything after midnight the night before surgery.  Let your caregiver know if you develop a cold or other infectious problem prior to surgery.  You should be present 60 minutes before the procedure or as directed. PROCEDURE  You will be given medicine that makes you sleep (general anesthetic). When you are asleep, your surgeon will make several small cuts (incisions) in your abdomen. One of these incisions is used to insert a small, lighted scope (laparoscope) into the abdomen. The laparoscope helps the surgeon see  into your abdomen. Carbon dioxide gas will be pumped into your abdomen. The gas allows more room for the surgeon to perform your surgery. Other operating instruments are inserted through the other incisions. Laparoscopic procedures may not be appropriate when:  There is major scarring from previous surgery.  The gallbladder is extremely inflamed.  There are bleeding disorders or unexpected cirrhosis of the liver.  A pregnancy is near term.  Other conditions make the laparoscopic procedure impossible. If your surgeon feels it is not safe to continue with a laparoscopic procedure, he or she will perform an open abdominal procedure. In this case, the surgeon will make an incision to open the abdomen. This gives the surgeon a larger view and field to work within. This may allow the surgeon to perform procedures that sometimes cannot be performed with a laparoscope alone. Open surgery has a longer recovery time. AFTER THE PROCEDURE  You will be taken to the recovery area where a nurse will watch and check your progress.  You may be allowed to go home the same day.  Do not resume physical activities until directed by your caregiver.  You may resume a normal diet and activities as directed. Document Released: 01/02/2005 Document Revised: 03/27/2011 Document Reviewed: 06/17/2010 [facility] Patient Information 2013 [place], [place]. PATIENT INSTRUCTIONS POST-ANESTHESIA  IMMEDIATELY FOLLOWING SURGERY:  Do not drive or operate machinery for the first twenty four hours after surgery.  Do not make any important decisions for twenty four hours after surgery or while taking narcotic pain medications or sedatives.  If you develop intractable nausea and vomiting or a severe headache please notify your doctor immediately.  FOLLOW-UP:  Please make an appointment with your surgeon as instructed. You do not need to follow up with anesthesia unless specifically instructed to do so.  WOUND CARE INSTRUCTIONS (if  applicable):  Keep a dry clean dressing on the anesthesia/puncture wound site if there is drainage.  Once the wound has quit draining you may leave it open to air.  Generally you should leave the bandage intact for twenty four hours unless there is drainage.  If the epidural site drains for more than 36-48 hours please call the anesthesia department.  QUESTIONS?:  Please feel free to call your physician or the hospital operator if you have any questions, and they will be happy to assist you.

## 2012-01-25 NOTE — H&P (Signed)
  NTS SOAP Note  Vital Signs:  Vitals as of: 01/25/2012: Systolic 128: Diastolic 89: Heart Rate 95: Temp 97.71F: Height 14ft 0in: Weight 125Lbs 0 Ounces: BMI 24  BMI : 24.41 kg/m2  Subjective: This 60 Years 21 Months old Female presents for of    ABDOMINAL ISSUES: ,Has been having intermittent right upper quadrant abdominal pain, nausea, and fatty food intolerance for several months now.  No fever, chills, jaundice.  Review of Symptoms:  Constitutional:unremarkable   Head:unremarkable    Eyes:unremarkable   Nose/Mouth/Throat:unremarkable Cardiovascular:  unremarkable   Respiratory:unremarkable   Gastrointestin    abdominal pain,nausea,vomiting,heartburn,dyspepsia Genitourinary:unremarkable     Musculoskeletal:unremarkable   Skin:unremarkable Hematolgic/Lymphatic:unremarkable     Allergic/Immunologic:unremarkable     Past Medical History:    Reviewed   Past Medical History  Surgical History: BTL Medical Problems: unremarkable Allergies: paxil Medications: none   Social History:Reviewed  Social History  Preferred Language: English Ethnicity: Not Hispanic / Latino Age: 32 Years 6 Months Marital Status:  M Alcohol: socially Recreational drug(s):  No   Smoking Status: Light tobacoo smoker reviewed on 01/25/2012 Started Date: 01/16/2001 Packs per day: 0.50 Functional Status reviewed on mm/dd/yyyy ------------------------------------------------ Bathing: Normal Cooking: Normal Dressing: Normal Driving: Normal Eating: Normal Managing Meds: Normal Oral Care: Normal Shopping: Normal Toileting: Normal Transferring: Normal Walking: Normal Cognitive Status reviewed on mm/dd/yyyy ------------------------------------------------ Attention: Normal Decision Making: Normal Language: Normal Memory: Normal Motor: Normal Perception: Normal Problem Solving: Normal Visual and Spatial: Normal   Family History:  Reviewed   Family History  Is there a family history [id number]    Objective Information: General:  Well appearing, well nourished in no distress.   no scleral icterus Heart:  RRR, no murmur or gallop.  Normal S1, S2.  No S3, S4.  Lungs:    CTA bilaterally, no wheezes, rhonchi, rales.  Breathing unlabored. Abdomen:Soft, NT/ND, no HSM, no masses.  Assessment:Biliary colic, cholelithiasis  Diagnosis &amp; Procedure:    Plan:Scheduled for laparoscopic cholecystectomy on 01/31/12.   Patient Education:Alternative treatments to surgery were discussed with patient (and family).  Risks and benefits  of procedure including bleeding, infection, hepatobiliary injury, and the possibility of an open procedure were fully explained to the patient (and family) who gave informed consent. Patient/family questions were addressed.  Follow-up:Pending Surgery                           Active Diagnosis and Procedures: 574.20 Calculus of gallbladder without mention of cholecystitis, without mention of obstruction   99203 - OFFICE OUTPATIENT NEW 30 MINUTES

## 2012-01-26 ENCOUNTER — Encounter (HOSPITAL_COMMUNITY)
Admission: RE | Admit: 2012-01-26 | Discharge: 2012-01-26 | Disposition: A | Discharge status: Home / Self Care | Payer: Medicaid Other | Source: Ambulatory Visit | Attending: General Surgery | Admitting: General Surgery

## 2012-01-26 ENCOUNTER — Encounter (HOSPITAL_COMMUNITY): Payer: Self-pay

## 2012-01-26 HISTORY — DX: Other complications of anesthesia, initial encounter: T88.59XA

## 2012-01-26 HISTORY — DX: Cardiac murmur, unspecified: R01.1

## 2012-01-26 HISTORY — DX: Adverse effect of unspecified anesthetic, initial encounter: T41.45XA

## 2012-01-26 LAB — HEPATIC FUNCTION PANEL
ALT: 14 U/L (ref 0–35)
AST: 13 U/L (ref 0–37)
Albumin: 4.4 g/dL (ref 3.5–5.2)
Alkaline Phosphatase: 56 U/L (ref 39–117)
Bilirubin, Direct: <0.1 mg/dL (ref 0.0–0.3)
Total Bilirubin: 0.3 mg/dL (ref 0.3–1.2)
Total Protein: 7.8 g/dL (ref 6.0–8.3)

## 2012-01-26 LAB — CBC WITH DIFFERENTIAL/PLATELET
Basophils Absolute: 0 10*3/uL (ref 0.0–0.1)
HCT: 39.9 % (ref 36.0–46.0)
Hemoglobin: 13.8 g/dL (ref 12.0–15.0)
Lymphocytes Relative: 28 % (ref 12–46)
Monocytes Absolute: 0.7 10*3/uL (ref 0.1–1.0)
Neutro Abs: 5.9 10*3/uL (ref 1.7–7.7)
RDW: 13.4 % (ref 11.5–15.5)
WBC: 9.3 10*3/uL (ref 4.0–10.5)

## 2012-01-26 LAB — BASIC METABOLIC PANEL
Chloride: 101 mEq/L (ref 96–112)
Creatinine, Ser: 0.68 mg/dL (ref 0.50–1.10)
GFR calc Af Amer: >90 mL/min (ref >90)
Potassium: 3.9 mEq/L (ref 3.5–5.1)
Sodium: 137 mEq/L (ref 135–145)

## 2012-01-31 ENCOUNTER — Ambulatory Visit (HOSPITAL_COMMUNITY)
Admission: RE | Admit: 2012-01-31 | Discharge: 2012-01-31 | Disposition: A | Discharge status: Home / Self Care | Payer: Medicaid Other | Source: Ambulatory Visit | Attending: General Surgery | Admitting: General Surgery

## 2012-01-31 ENCOUNTER — Encounter (HOSPITAL_COMMUNITY)
Admission: RE | Disposition: A | Discharge status: Home / Self Care | Payer: Self-pay | Source: Ambulatory Visit | Attending: General Surgery

## 2012-01-31 ENCOUNTER — Ambulatory Visit (HOSPITAL_COMMUNITY): Payer: Medicaid Other | Admitting: Anesthesiology

## 2012-01-31 ENCOUNTER — Encounter (HOSPITAL_COMMUNITY): Payer: Self-pay | Admitting: *Deleted

## 2012-01-31 ENCOUNTER — Encounter (HOSPITAL_COMMUNITY): Payer: Self-pay | Admitting: Anesthesiology

## 2012-01-31 DIAGNOSIS — K8010 Calculus of gallbladder with chronic cholecystitis without obstruction: Secondary | ICD-10-CM | POA: Insufficient documentation

## 2012-01-31 HISTORY — PX: CHOLECYSTECTOMY: SHX55

## 2012-01-31 SURGERY — LAPAROSCOPIC CHOLECYSTECTOMY
Anesthesia: General | Site: Abdomen | Wound class: Contaminated

## 2012-01-31 MED ORDER — HYDROCODONE-ACETAMINOPHEN 5-325 MG PO TABS: 1.0000 | ORAL_TABLET | ORAL | Status: AC | PRN

## 2012-01-31 MED ORDER — MIDAZOLAM HCL 2 MG/2ML IJ SOLN
INTRAMUSCULAR | Status: AC
  Filled 2012-01-31: qty 2

## 2012-01-31 MED ORDER — ONDANSETRON HCL 4 MG/2ML IJ SOLN
INTRAMUSCULAR | Status: AC
  Filled 2012-01-31: qty 2

## 2012-01-31 MED ORDER — GLYCOPYRROLATE 0.2 MG/ML IJ SOLN
INTRAMUSCULAR | Status: AC
  Filled 2012-01-31: qty 1

## 2012-01-31 MED ORDER — PROPOFOL 10 MG/ML IV EMUL
INTRAVENOUS | Status: AC
  Filled 2012-01-31: qty 20

## 2012-01-31 MED ORDER — FENTANYL CITRATE 0.05 MG/ML IJ SOLN
INTRAMUSCULAR | Status: DC | PRN
  Administered 2012-01-31: 100 ug via INTRAVENOUS
  Administered 2012-01-31 (×3): 50 ug via INTRAVENOUS

## 2012-01-31 MED ORDER — GLYCOPYRROLATE 0.2 MG/ML IJ SOLN
INTRAMUSCULAR | Status: DC | PRN
  Administered 2012-01-31: 0.6 mg via INTRAVENOUS

## 2012-01-31 MED ORDER — CEFOTETAN DISODIUM-DEXTROSE 2-2.08 GM-% IV SOLR
2.0000 g | Freq: Once | INTRAVENOUS | Status: AC
  Administered 2012-01-31: 2 g via INTRAVENOUS

## 2012-01-31 MED ORDER — LACTATED RINGERS IV SOLN
INTRAVENOUS | Status: DC
  Administered 2012-01-31: 1000 mL via INTRAVENOUS

## 2012-01-31 MED ORDER — GLYCOPYRROLATE 0.2 MG/ML IJ SOLN
INTRAMUSCULAR | Status: AC
  Filled 2012-01-31: qty 2

## 2012-01-31 MED ORDER — BUPIVACAINE HCL (PF) 0.5 % IJ SOLN
INTRAMUSCULAR | Status: AC
  Filled 2012-01-31: qty 30

## 2012-01-31 MED ORDER — KETOROLAC TROMETHAMINE 30 MG/ML IJ SOLN
INTRAMUSCULAR | Status: AC
  Filled 2012-01-31: qty 1

## 2012-01-31 MED ORDER — MIDAZOLAM HCL 2 MG/2ML IJ SOLN
1.0000 mg | INTRAMUSCULAR | Status: DC | PRN
  Administered 2012-01-31: 2 mg via INTRAVENOUS

## 2012-01-31 MED ORDER — LIDOCAINE HCL (CARDIAC) 10 MG/ML IV SOLN
INTRAVENOUS | Status: DC | PRN
  Administered 2012-01-31: 20 mg via INTRAVENOUS

## 2012-01-31 MED ORDER — NEOSTIGMINE METHYLSULFATE 1 MG/ML IJ SOLN
INTRAMUSCULAR | Status: DC | PRN
  Administered 2012-01-31: 3 mg via INTRAVENOUS

## 2012-01-31 MED ORDER — FENTANYL CITRATE 0.05 MG/ML IJ SOLN
25.0000 ug | INTRAMUSCULAR | Status: DC | PRN
  Administered 2012-01-31 (×4): 50 ug via INTRAVENOUS

## 2012-01-31 MED ORDER — ONDANSETRON HCL 4 MG/2ML IJ SOLN: 4.0000 mg | Freq: Once | INTRAMUSCULAR | Status: DC | PRN

## 2012-01-31 MED ORDER — FENTANYL CITRATE 0.05 MG/ML IJ SOLN
INTRAMUSCULAR | Status: AC
  Filled 2012-01-31: qty 2

## 2012-01-31 MED ORDER — FENTANYL CITRATE 0.05 MG/ML IJ SOLN
INTRAMUSCULAR | Status: AC
  Filled 2012-01-31: qty 5

## 2012-01-31 MED ORDER — KETOROLAC TROMETHAMINE 30 MG/ML IJ SOLN
30.0000 mg | Freq: Once | INTRAMUSCULAR | Status: AC
  Administered 2012-01-31: 30 mg via INTRAVENOUS

## 2012-01-31 MED ORDER — ENOXAPARIN SODIUM 40 MG/0.4ML ~~LOC~~ SOLN
SUBCUTANEOUS | Status: AC
  Filled 2012-01-31: qty 0.4

## 2012-01-31 MED ORDER — ROCURONIUM BROMIDE 100 MG/10ML IV SOLN
INTRAVENOUS | Status: DC | PRN
  Administered 2012-01-31: 5 mg via INTRAVENOUS
  Administered 2012-01-31: 25 mg via INTRAVENOUS

## 2012-01-31 MED ORDER — BUPIVACAINE HCL (PF) 0.5 % IJ SOLN
INTRAMUSCULAR | Status: DC | PRN
  Administered 2012-01-31: 10 mL

## 2012-01-31 MED ORDER — HEMOSTATIC AGENTS (NO CHARGE) OPTIME
TOPICAL | Status: DC | PRN
  Administered 2012-01-31: 1 via TOPICAL

## 2012-01-31 MED ORDER — LIDOCAINE HCL (PF) 1 % IJ SOLN
INTRAMUSCULAR | Status: AC
  Filled 2012-01-31: qty 5

## 2012-01-31 MED ORDER — GLYCOPYRROLATE 0.2 MG/ML IJ SOLN
0.2000 mg | Freq: Once | INTRAMUSCULAR | Status: AC
  Administered 2012-01-31: 0.2 mg via INTRAVENOUS

## 2012-01-31 MED ORDER — PROPOFOL 10 MG/ML IV BOLUS
INTRAVENOUS | Status: DC | PRN
  Administered 2012-01-31: 150 mg via INTRAVENOUS

## 2012-01-31 MED ORDER — SODIUM CHLORIDE 0.9 % IR SOLN
Status: DC | PRN
  Administered 2012-01-31: 1000 mL

## 2012-01-31 MED ORDER — ONDANSETRON HCL 4 MG/2ML IJ SOLN
4.0000 mg | Freq: Once | INTRAMUSCULAR | Status: AC
  Administered 2012-01-31: 4 mg via INTRAVENOUS

## 2012-01-31 MED ORDER — CEFOTETAN DISODIUM-DEXTROSE 2-2.08 GM-% IV SOLR
INTRAVENOUS | Status: AC
  Filled 2012-01-31: qty 50

## 2012-01-31 MED ORDER — ROCURONIUM BROMIDE 50 MG/5ML IV SOLN
INTRAVENOUS | Status: AC
  Filled 2012-01-31: qty 1

## 2012-01-31 MED ORDER — ENOXAPARIN SODIUM 40 MG/0.4ML ~~LOC~~ SOLN
40.0000 mg | Freq: Once | SUBCUTANEOUS | Status: AC
  Administered 2012-01-31: 40 mg via SUBCUTANEOUS

## 2012-01-31 SURGICAL SUPPLY — 34 items
APPLIER CLIP LAPSCP 10X32 DD (CLIP) ×2 IMPLANT
BAG HAMPER (MISCELLANEOUS) ×2 IMPLANT
CLOTH BEACON ORANGE TIMEOUT ST (SAFETY) ×2 IMPLANT
COVER LIGHT HANDLE STERIS (MISCELLANEOUS) ×4 IMPLANT
DECANTER SPIKE VIAL GLASS SM (MISCELLANEOUS) ×2 IMPLANT
DURAPREP 26ML APPLICATOR (WOUND CARE) ×2 IMPLANT
ELECT REM PT RETURN 9FT ADLT (ELECTROSURGICAL) ×2
ELECTRODE REM PT RTRN 9FT ADLT (ELECTROSURGICAL) ×1 IMPLANT
FILTER SMOKE EVAC LAPAROSHD (FILTER) ×2 IMPLANT
FORMALIN 10 PREFIL 120ML (MISCELLANEOUS) ×2 IMPLANT
GLOVE BIO SURGEON STRL SZ7.5 (GLOVE) ×2 IMPLANT
GLOVE BIOGEL PI IND STRL 7.0 (GLOVE) ×2 IMPLANT
GLOVE BIOGEL PI INDICATOR 7.0 (GLOVE) ×2
GLOVE ECLIPSE 6.5 STRL STRAW (GLOVE) ×4 IMPLANT
GLOVE SS BIOGEL STRL SZ 6.5 (GLOVE) ×1 IMPLANT
GLOVE SUPERSENSE BIOGEL SZ 6.5 (GLOVE) ×1
GOWN STRL REIN XL XLG (GOWN DISPOSABLE) ×6 IMPLANT
HEMOSTAT SNOW SURGICEL 2X4 (HEMOSTASIS) ×2 IMPLANT
INST SET LAPROSCOPIC AP (KITS) ×2 IMPLANT
KIT ROOM TURNOVER APOR (KITS) ×2 IMPLANT
KIT TROCAR LAP CHOLE (TROCAR) ×2 IMPLANT
MANIFOLD NEPTUNE II (INSTRUMENTS) ×2 IMPLANT
NEEDLE INSUFFLATION 14GA 120MM (NEEDLE) ×2 IMPLANT
NS IRRIG 1000ML POUR BTL (IV SOLUTION) ×2 IMPLANT
PACK LAP CHOLE LZT030E (CUSTOM PROCEDURE TRAY) ×2 IMPLANT
PAD ARMBOARD 7.5X6 YLW CONV (MISCELLANEOUS) ×2 IMPLANT
POUCH SPECIMEN RETRIEVAL 10MM (ENDOMECHANICALS) ×2 IMPLANT
SET BASIN LINEN APH (SET/KITS/TRAYS/PACK) ×2 IMPLANT
SPONGE GAUZE 2X2 8PLY STRL LF (GAUZE/BANDAGES/DRESSINGS) ×8 IMPLANT
STAPLER VISISTAT (STAPLE) ×2 IMPLANT
SUT VICRYL 0 UR6 27IN ABS (SUTURE) ×2 IMPLANT
TAPE CLOTH SURG 4X10 WHT LF (GAUZE/BANDAGES/DRESSINGS) ×2 IMPLANT
WARMER LAPAROSCOPE (MISCELLANEOUS) ×2 IMPLANT
YANKAUER SUCT 12FT TUBE ARGYLE (SUCTIONS) ×2 IMPLANT

## 2012-01-31 NOTE — Anesthesia Procedure Notes (Signed)
Procedure Name: Intubation Date/Time: 01/31/2012 12:11 PM Performed by: Franco Nones Pre-anesthesia Checklist: Patient identified, Patient being monitored, Timeout performed, Emergency Drugs available and Suction available Patient Re-evaluated:Patient Re-evaluated prior to inductionOxygen Delivery Method: Circle System Utilized Preoxygenation: Pre-oxygenation with 100% oxygen Intubation Type: IV induction Ventilation: Mask ventilation without difficulty Laryngoscope Size: Miller and 2 Grade View: Grade I Tube type: Oral Tube size: 7.0 mm Number of attempts: 1 Airway Equipment and Method: stylet Placement Confirmation: ETT inserted through vocal cords under direct vision,  positive ETCO2 and breath sounds checked- equal and bilateral Secured at: 21 cm Tube secured with: Tape Dental Injury: Teeth and Oropharynx as per pre-operative assessment

## 2012-01-31 NOTE — Anesthesia Preprocedure Evaluation (Signed)
Anesthesia Evaluation  Patient identified by MRN, date of birth, ID band Patient awake    Reviewed: Allergy & Precautions, H&P , NPO status , Patient's Chart, lab work & pertinent test results  History of Anesthesia Complications (+) Emergence Delirium  Airway Mallampati: II      Dental  (+) Teeth Intact   Pulmonary Current Smoker,  breath sounds clear to auscultation        Cardiovascular Rhythm:Regular Rate:Normal     Neuro/Psych PSYCHIATRIC DISORDERS Anxiety Depression    GI/Hepatic   Endo/Other    Renal/GU      Musculoskeletal   Abdominal   Peds  Hematology   Anesthesia Other Findings   Reproductive/Obstetrics                           Anesthesia Physical Anesthesia Plan  ASA: II  Anesthesia Plan: General   Post-op Pain Management:    Induction: Intravenous  Airway Management Planned: Oral ETT  Additional Equipment:   Intra-op Plan:   Post-operative Plan: Extubation in OR  Informed Consent: I have reviewed the patients History and Physical, chart, labs and discussed the procedure including the risks, benefits and alternatives for the proposed anesthesia with the patient or authorized representative who has indicated his/her understanding and acceptance.     Plan Discussed with:   Anesthesia Plan Comments:         Anesthesia Quick Evaluation

## 2012-01-31 NOTE — Anesthesia Postprocedure Evaluation (Signed)
Anesthesia Post Note  Patient: Cynthia Russell  Procedure(s) Performed: Procedure(s) (LRB): LAPAROSCOPIC CHOLECYSTECTOMY (N/A)  Anesthesia type: General  Patient location: PACU  Post pain: Pain level controlled  Post assessment: Post-op Vital signs reviewed, Patient's Cardiovascular Status Stable, Respiratory Function Stable, Patent Airway, No signs of Nausea or vomiting and Pain level controlled  Last Vitals:  Filed Vitals:   01/31/12 1256  BP: 119/89  Pulse: 88  Temp: 36.8 C  Resp: 16    Post vital signs: Reviewed and stable  Level of consciousness: awake and alert   Complications: No apparent anesthesia complications

## 2012-01-31 NOTE — Transfer of Care (Addendum)
Immediate Anesthesia Transfer of Care Note  Patient: Cynthia Russell  Procedure(s) Performed: Procedure(s) (LRB): LAPAROSCOPIC CHOLECYSTECTOMY (N/A)  Patient Location: PACU  Anesthesia Type: General  Level of Consciousness: awake  Airway & Oxygen Therapy: Patient Spontanous Breathing   Post-op Assessment: Report given to PACU RN, Post -op Vital signs reviewed and stable and Patient moving all extremities  Post vital signs: Reviewed and stable  Complications: No apparent anesthesia complications

## 2012-01-31 NOTE — Op Note (Signed)
Patient:  Cynthia Russell  DOB:  1980-10-03  MRN:  [id number]   Preop Diagnosis:  Biliary colic, cholelithiasis  Postop Diagnosis:  Same  Procedure:  Laparoscopic cholecystectomy  Surgeon:  Franky Macho, M.D.  Anes:  General endotracheal  Indications:  Patient is a 32 year old white female presents with biliary colic secondary to cholelithiasis. The risks and benefits of the procedure including bleeding, infection, hepatobiliary injury, and the possibility of an open procedure were fully explained to the patient, who gave informed consent.  Procedure note:  The patient was placed in the supine position. After induction of general endotracheal anesthesia, the abdomen was prepped and draped using usual sterile technique with DuraPrep. Surgical site confirmation was performed.  An infraumbilical incision was made down to the fascia. A Veress needle was introduced into the abdominal cavity and confirmation of placement was done using the saline drop test. The abdomen was then insufflated to 16 mm mercury pressure. An 11 mm trocar was introduced into the abdominal cavity under direct visualization without difficulty. The patient was placed in reverse Trendelenburg position and additional 11 mm trocar was placed the epigastric region and 5 mm trochars were placed the right upper quadrant and right flank regions. The liver was inspected and noted within normal limits. The gallbladder was retracted in a dynamic fashion in order to expose the triangle of Calot. The cystic duct was first identified. Junction are to the infundibulum flow identified. Endoclips were placed proximally and distally on the cystic duct, and the cystic duct was divided. This is likewise done cystic artery. The gallbladder was then freed away from the gallbladder fossa using Bovie electrocautery. The gallbladder delivered through the epigastric trocar site using an Endo Catch bag. The gallbladder fossa was inspected no abnormal  bleeding or bile leakage was noted. Surgicel is placed the gallbladder fossa. All fluid and air were then evacuated from the abdominal cavity prior to removal of the trochars.  All wounds were irrigated with normal saline. All wounds were injected with 0.5% Sensorcaine. The infraumbilical fascia as well as epigastric fascia were reapproximated using 0 Vicryl interrupted sutures. All skin incisions were closed using staples. Betadine ointment and dry sterile dressings were applied.  All tape and needle counts were correct at the end of the procedure. The patient was extubated in the operating room and transferred to PACU in stable condition.  Complications:  None  EBL:  Minimal  Specimen:  Gallbladder

## 2012-01-31 NOTE — Interval H&P Note (Signed)
History and Physical Interval Note:  01/31/2012 11:31 AM  Cynthia Russell  has presented today for surgery, with the diagnosis of Cholelithiasis  The various methods of treatment have been discussed with the patient and family. After consideration of risks, benefits and other options for treatment, the patient has consented to  Procedure(s) (LRB) with comments: LAPAROSCOPIC CHOLECYSTECTOMY (N/A) as a surgical intervention .  The patient's history has been reviewed, patient examined, no change in status, stable for surgery.  I have reviewed the patient's chart and labs.  Questions were answered to the patient's satisfaction.     Franky Macho A

## 2012-02-02 ENCOUNTER — Encounter (HOSPITAL_COMMUNITY): Payer: Self-pay | Admitting: General Surgery

## 2013-02-01 IMAGING — CT CT ABD-PELV W/ CM
2 of 3 series · 16 of 46 positions shown, 18 images · IV contrast (Omnipaque 300)
Comparison: None.

CLINICAL DATA: abdominal pain

CT ABDOMEN AND PELVIS WITH CONTRAST
TECHNIQUE: Multidetector CT imaging of the abdomen and pelvis was
performed following the standard protocol during bolus
administration of intravenous contrast.
Contrast: 100mL OMNIPAQUE IOHEXOL 300 MG/ML  SOLN

[Series 2: abd_pel_with 5.0 b40f · axial · 0.61mm/px · z∈[-414,-10]mm · 13 of 93 slices shown, 15 images]
[im 6/93  soft-tissue]
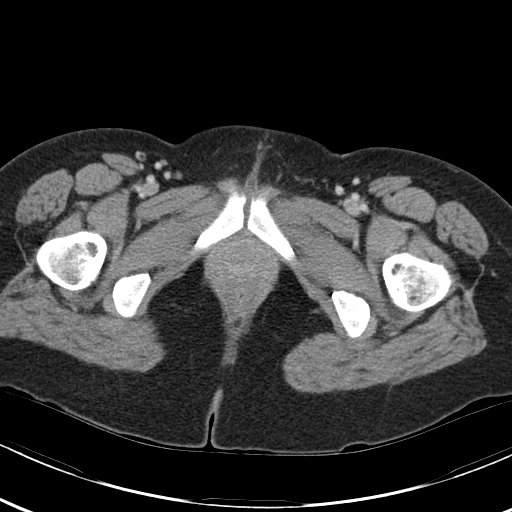
[im 6/93  bone]
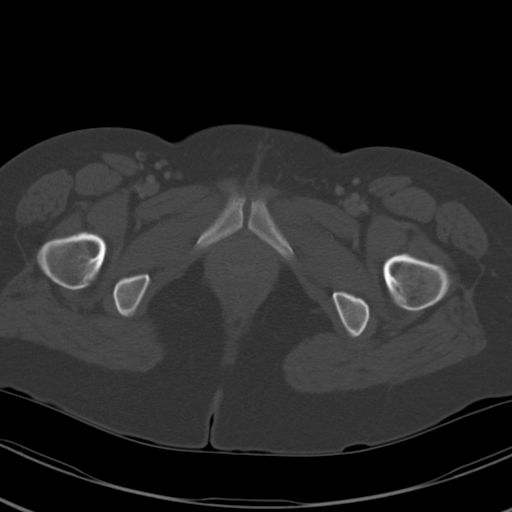
[im 12/93  soft-tissue]
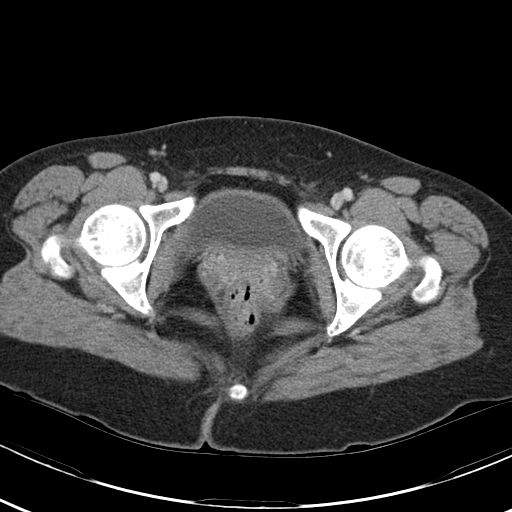
[im 18/93  soft-tissue]
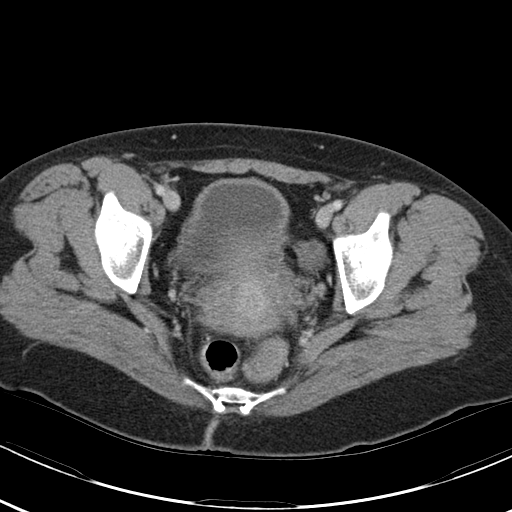
[im 27/93  soft-tissue]
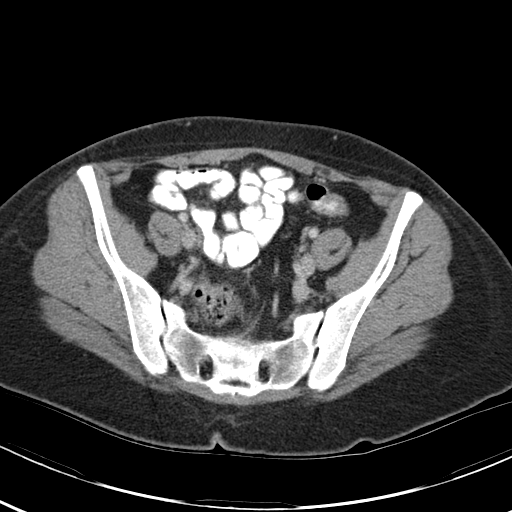
[im 33/93  soft-tissue]
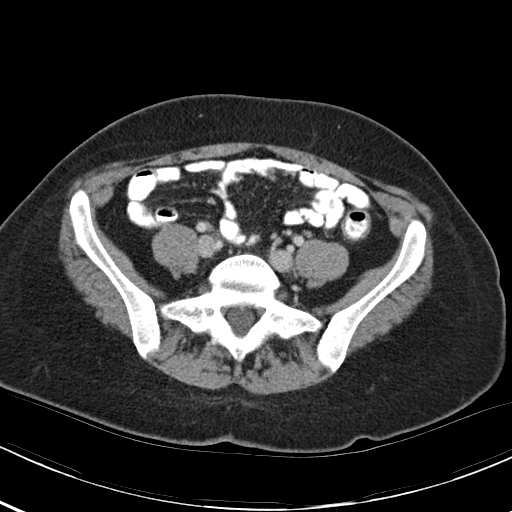
[im 39/93  soft-tissue]
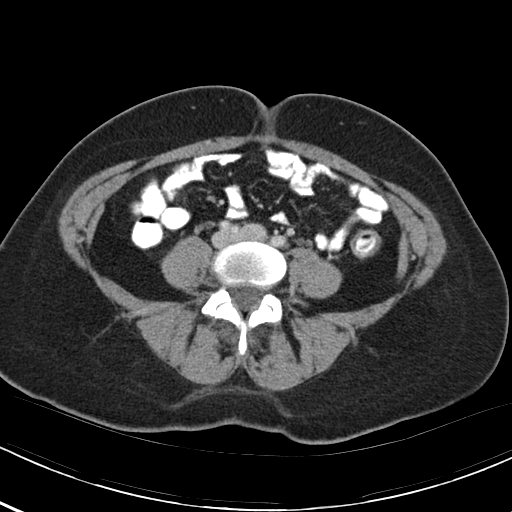
[im 48/93  soft-tissue]
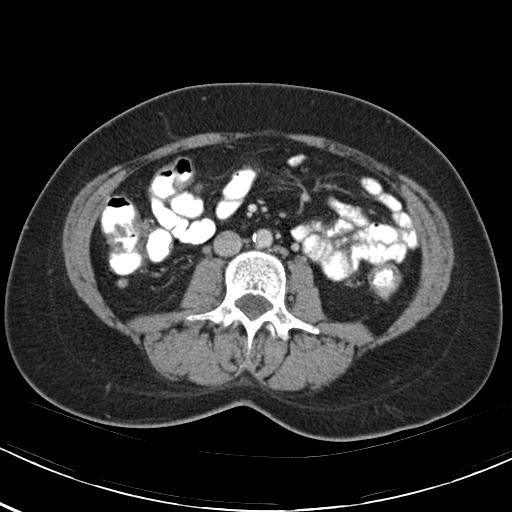
[im 54/93  soft-tissue]
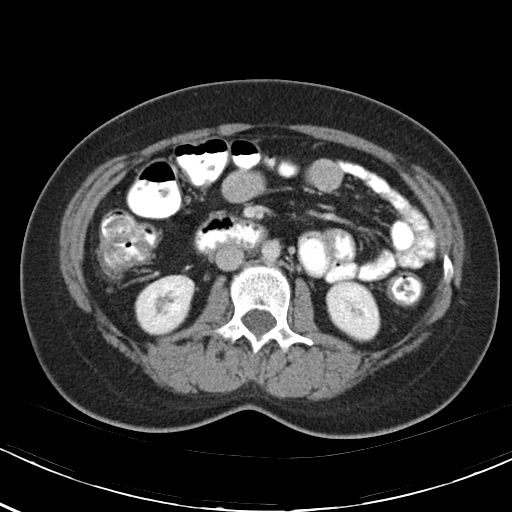
[im 60/93  soft-tissue]
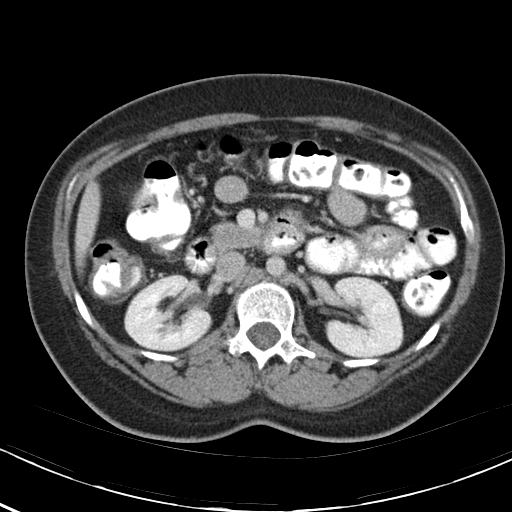
[im 60/93  bone]
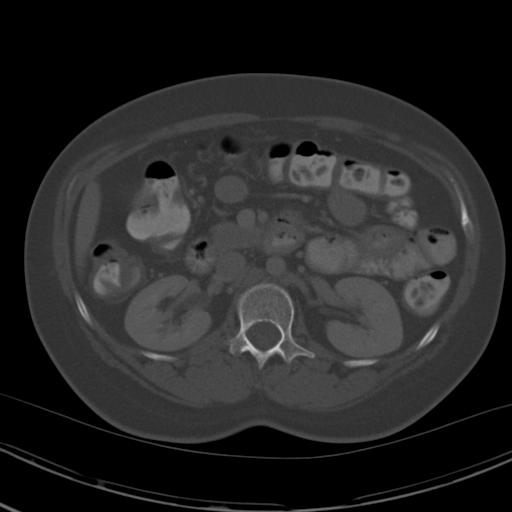
[im 66/93  soft-tissue]
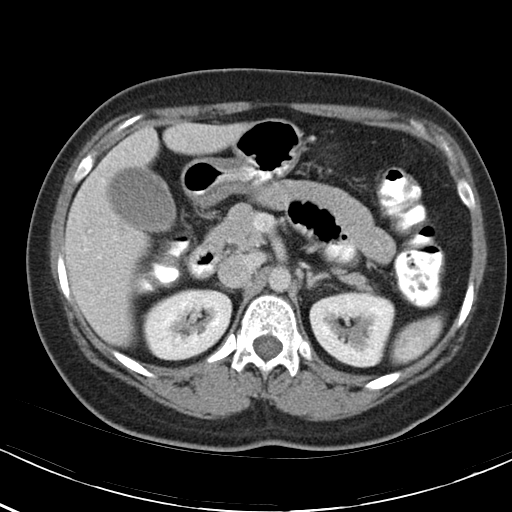
[im 75/93  soft-tissue]
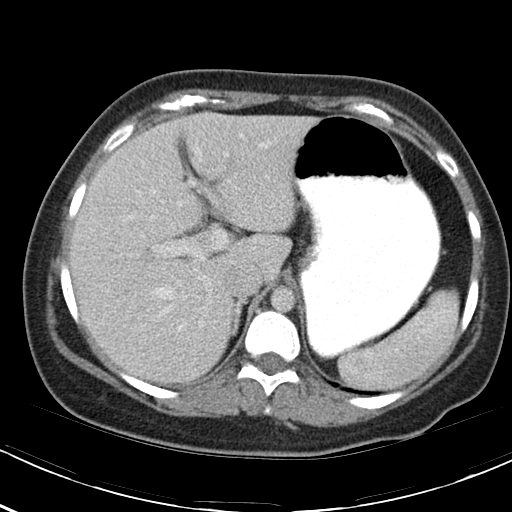
[im 81/93  soft-tissue]
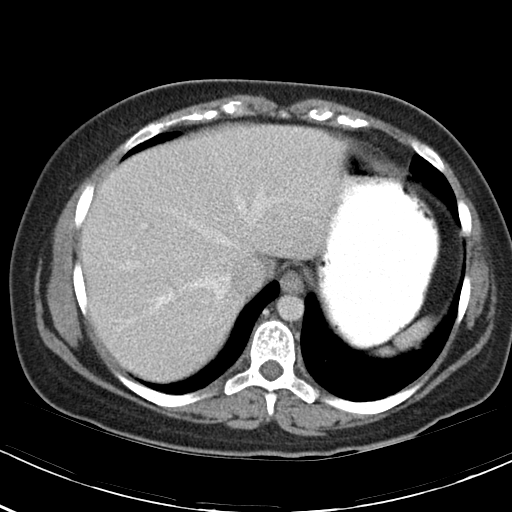
[im 87/93  soft-tissue]
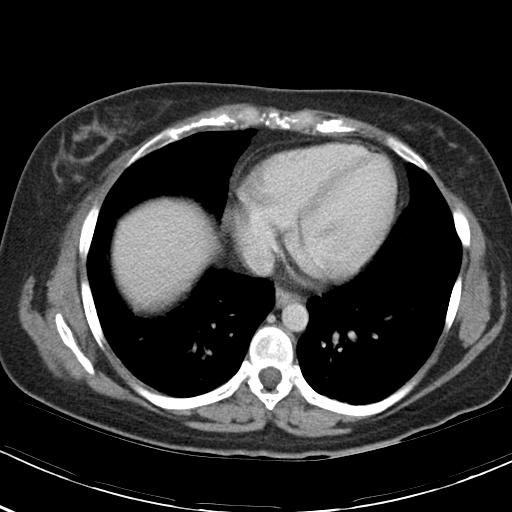

[Series 4: abd_pel_with 3.0 spo · coronal · 0.61mm/px · 3 of 78 slices shown]
[im 26/78  soft-tissue]
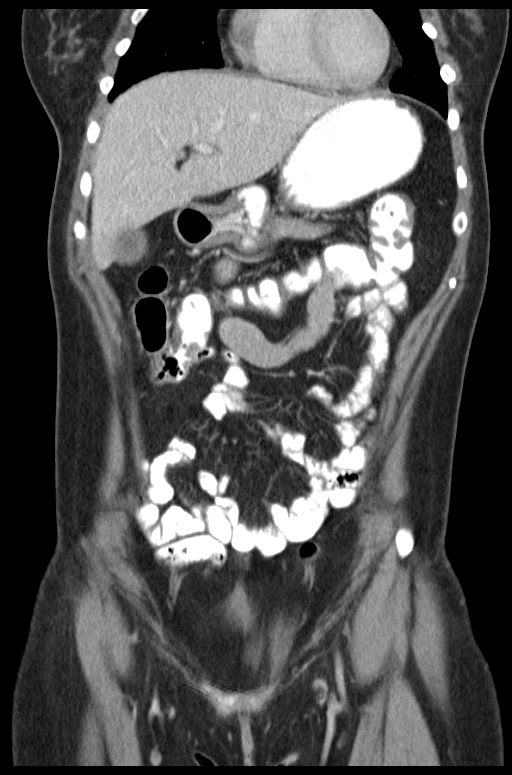
[im 35/78  soft-tissue]
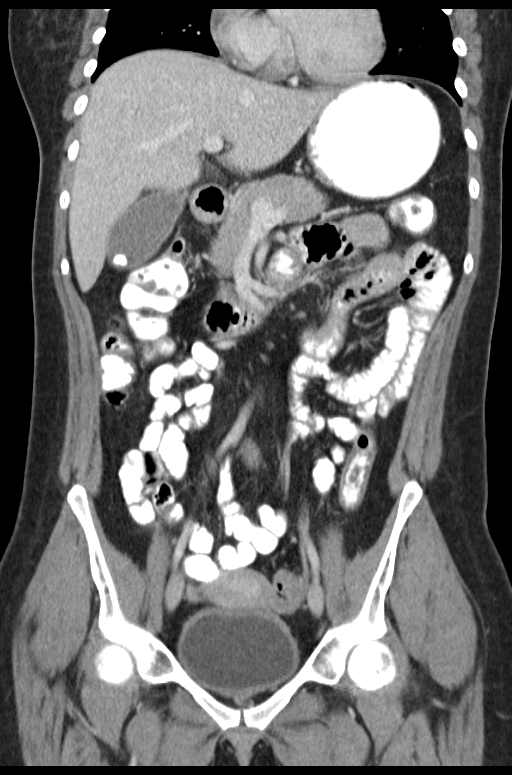
[im 43/78  soft-tissue]
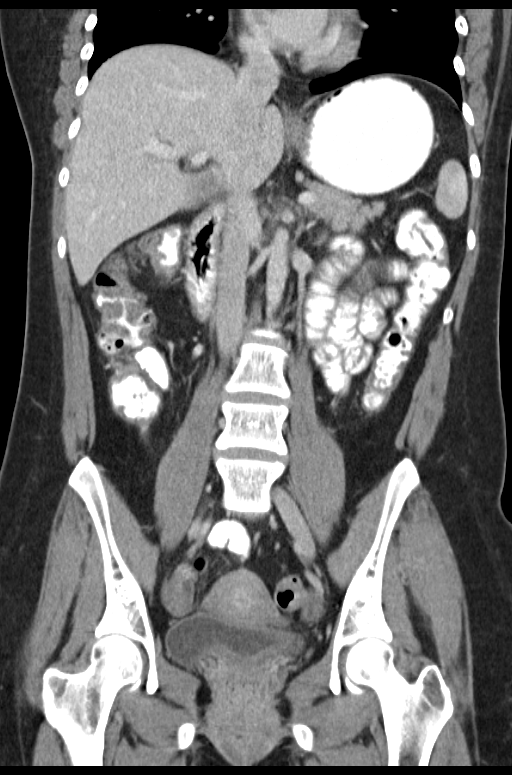

[16 of 46 positions shown; findings below may reference images not displayed]

FINDINGS: Lung bases:  No pericardial or pleural effusions.  Nonspecific
ground-glass attenuation noted within the bilateral posterior lower
lobes.  No airspace consolidation.

Abdomen/pelvis:
Stones are identified within the lumen of the gallbladder.  The
largest is in the gallbladder neck measuring 1.5 cm, image 22.  No
suspicious liver abnormality.
No biliary dilatation.  The pancreas is normal.  Normal appearance
of the spleen.

The adrenal glands are both negative.  Right kidney normal.  The
left kidney is normal.

Urinary bladder is unremarkable.  Uterus and adnexal structures
have a normal physiologic appearance.

The abdominal aorta is normal in caliber.  No adenopathy identified
within the abdomen or the pelvis.  No inguinal adenopathy.  No free
fluid or fluid collections noted.

The stomach and the small bowel loops are unremarkable.  The
appendix is visualized and appears normal.  Normal appearance of
the colon.

Bones/Musculoskeletal:  Review of the visualized osseous structures
is unremarkable.
IMPRESSION: 1.  No acute findings identified within the abdomen or pelvis..
2.  Gallstones.
3.  Nonspecific ground-glass attenuation within both lower lobes
likely related to pneumonitis or dependent change.

## 2021-02-21 ENCOUNTER — Other Ambulatory Visit: Payer: Self-pay

## 2021-02-21 ENCOUNTER — Emergency Department (HOSPITAL_COMMUNITY)
Admission: EM | Admit: 2021-02-21 | Discharge: 2021-02-21 | Disposition: A | Discharge status: Home / Self Care | Payer: Commercial Managed Care - PPO | Attending: Emergency Medicine | Admitting: Emergency Medicine

## 2021-02-21 ENCOUNTER — Encounter (HOSPITAL_COMMUNITY): Payer: Self-pay

## 2021-02-21 ENCOUNTER — Emergency Department (HOSPITAL_COMMUNITY): Payer: Commercial Managed Care - PPO

## 2021-02-21 DIAGNOSIS — S0990XA Unspecified injury of head, initial encounter: Secondary | ICD-10-CM

## 2021-02-21 DIAGNOSIS — M542 Cervicalgia: Secondary | ICD-10-CM | POA: Diagnosis not present

## 2021-02-21 DIAGNOSIS — S060X0A Concussion without loss of consciousness, initial encounter: Secondary | ICD-10-CM

## 2021-02-21 DIAGNOSIS — W01198A Fall on same level from slipping, tripping and stumbling with subsequent striking against other object, initial encounter: Secondary | ICD-10-CM | POA: Insufficient documentation

## 2021-02-21 DIAGNOSIS — Y92002 Bathroom of unspecified non-institutional (private) residence single-family (private) house as the place of occurrence of the external cause: Secondary | ICD-10-CM | POA: Diagnosis not present

## 2021-02-21 MED ORDER — ONDANSETRON 4 MG PO TBDP: 4.0000 mg | ORAL_TABLET | Freq: Three times a day (TID) | ORAL | 0 refills | Status: AC | PRN

## 2021-02-21 NOTE — ED Triage Notes (Signed)
Patient states she was getting out of the shower and slipped, hitting the back of her head on the floor. Patient has a hematoma. No LOC. No blood thinners. Patient c/o dizziness, blurred vision, and nausea.

## 2021-02-21 NOTE — Discharge Instructions (Addendum)
Your CT scans did not show any acute findings. Your symptoms are likely related to a mild concussion. Please see additional information on concussions. It is very important to allow your  brain to rest - this includes sitting in a darkened room whenever possible and avoiding bright lights from cellphones, TV screens, computers, etc.   Take Ibuprofen and Tylenol as needed for pain. Apply ice to the posterior aspect of your head to help with inflammation. Pick up nausea medication and take as needed.   Follow up with your PCP for further eval. There is a [phone]  number on this discharge paperwork to help find a PCP that accepts your insurance.   Return to the ED for any new/worsening symptoms

## 2021-02-21 NOTE — ED Provider Notes (Signed)
[facility] DEPT Provider Note   CSN: [id number] Arrival date & time: 02/21/21  1019     History  Chief Complaint  Patient presents with   Fall   Head Injury    KRYSTIE REASE is a 41 y.o. female who presents to the ED today s/p mechanical fall that occurred this morning.  Patient states that she was stepping out of the shower when she slipped on the wet ground causing her to fall backwards.  She hit her head on the basement concrete floor.  She denies head injury.  He states that since that time she has had nausea and blurry vision.  She states that she feels a large knot to the posterior aspect of her head.  She is not anticoagulated.  She also complains of neck pain.  She has no other complaints at this time.  Has not taken anything for pain prior to arrival.  The history is provided by the patient and medical records.      Home Medications Prior to Admission medications   Medication Sig Start Date End Date Taking? Authorizing Provider  ondansetron (ZOFRAN-ODT) 4 MG disintegrating tablet Take 1 tablet (4 mg total) by mouth every 8 (eight) hours as needed for nausea or vomiting. 02/21/21  Yes Kaelon Weekes, PA-C  HYDROcodone-acetaminophen (NORCO/VICODIN) 5-325 MG per tablet Take 1-2 tablets by mouth every 4 (four) hours as needed. 01/31/12   Aviva Signs, MD  promethazine (PHENERGAN) 25 MG tablet Take 25 mg by mouth every 6 (six) hours as needed.    [provider]      Allergies    Paxil [paroxetine hcl]    Review of Systems   Review of Systems  Constitutional:  Negative for chills and fever.  Eyes:  Positive for visual disturbance.  Gastrointestinal:  Positive for nausea. Negative for vomiting.  Neurological:  Positive for headaches. Negative for syncope.  All other systems reviewed and are negative.  Physical Exam Updated Vital Signs BP 139/77    Pulse 77    Temp 98 F (36.7 C) (Oral)    Resp 18    Ht 4\' 11"  (1.499 m)    Wt 49.9  kg    LMP 02/07/2021 (Approximate)    SpO2 97%    BMI 22.22 kg/m  Physical Exam Vitals and nursing note reviewed.  Constitutional:      Appearance: She is not ill-appearing or diaphoretic.  HENT:     Head: Normocephalic.     Comments: Large hematoma noted to left posterior head/occiput region with associated TTP.  No raccoon's sign or battle's sign.  Eyes:     Extraocular Movements: Extraocular movements intact.     Conjunctiva/sclera: Conjunctivae normal.     Pupils: Pupils are equal, round, and reactive to light.  Neck:     Comments: + diffuse midline and bilateral paracervical musculature TTP Cardiovascular:     Rate and Rhythm: Normal rate and regular rhythm.     Pulses: Normal pulses.  Pulmonary:     Effort: Pulmonary effort is normal.     Breath sounds: Normal breath sounds. No wheezing, rhonchi or rales.  Musculoskeletal:     Cervical back: Tenderness present.  Skin:    General: Skin is warm and dry.     Coloration: Skin is not jaundiced.  Neurological:     Mental Status: She is alert.     Comments: Alert and oriented to self, place, time and event.   Speech is fluent, clear without  dysarthria or dysphasia.   Strength 5/5 in upper/lower extremities   Sensation intact in upper/lower extremities   Normal gait.  Negative Romberg. No pronator drift.  Normal finger-to-nose and feet tapping.  CN I not tested  CN II grossly intact visual fields bilaterally. Did not visualize posterior eye.  CN III, IV, VI PERRLA and EOMs intact bilaterally  CN V Intact sensation to sharp and light touch to the face  CN VII facial movements symmetric  CN VIII not tested  CN IX, X no uvula deviation, symmetric rise of soft palate  CN XI 5/5 SCM and trapezius strength bilaterally  CN XII Midline tongue protrusion, symmetric L/R movements      ED Results / Procedures / Treatments   Labs (all labs ordered are listed, but only abnormal results are displayed) Labs Reviewed - No data to  display  EKG None  Radiology CT Head Wo Contrast  Result Date: 02/21/2021 CLINICAL DATA:  Post fall EXAM: CT HEAD WITHOUT CONTRAST CT CERVICAL SPINE WITHOUT CONTRAST TECHNIQUE: Multidetector CT imaging of the head and cervical spine was performed following the standard protocol without intravenous contrast. Multiplanar CT image reconstructions of the cervical spine were also generated. RADIATION DOSE REDUCTION: This exam was performed according to the departmental dose-optimization program which includes automated exposure control, adjustment of the mA and/or kV according to patient size and/or use of iterative reconstruction technique. COMPARISON:  None. FINDINGS: CT HEAD FINDINGS Brain: No evidence of acute infarction, hemorrhage, hydrocephalus, extra-axial collection or mass lesion/mass effect. Vascular: No hyperdense vessel or unexpected calcification. Skull: No osseous abnormality. Sinuses/Orbits: Visualized paranasal sinuses are clear. Visualized mastoid sinuses are clear. Visualized orbits demonstrate no focal abnormality. Other: Left parietal scalp hematoma CT CERVICAL SPINE FINDINGS Alignment: No static listhesis. Loss of the normal cervical lordosis with straightening. Skull base and vertebrae: No acute fracture. No primary bone lesion or focal pathologic process. Soft tissues and spinal canal: No prevertebral fluid or swelling. No visible canal hematoma. Disc levels: Mild degenerative disease with disc height loss at C5-6. Broad-based disc osteophyte complex at C5-6 with bilateral uncovertebral degenerative changes and foraminal encroachment. Remainder the disc spaces are maintained. No other foraminal or central canal stenosis. Upper chest: Lung apices are clear. Other: No fluid collection or hematoma. IMPRESSION: 1. No acute intracranial pathology. 2. Left parietal scalp hematoma. 3. No acute fracture or subluxation of the cervical spine. Electronically Signed   By: Kathreen Devoid M.D.   On:  02/21/2021 12:10   CT Cervical Spine Wo Contrast  Result Date: 02/21/2021 CLINICAL DATA:  Post fall EXAM: CT HEAD WITHOUT CONTRAST CT CERVICAL SPINE WITHOUT CONTRAST TECHNIQUE: Multidetector CT imaging of the head and cervical spine was performed following the standard protocol without intravenous contrast. Multiplanar CT image reconstructions of the cervical spine were also generated. RADIATION DOSE REDUCTION: This exam was performed according to the departmental dose-optimization program which includes automated exposure control, adjustment of the mA and/or kV according to patient size and/or use of iterative reconstruction technique. COMPARISON:  None. FINDINGS: CT HEAD FINDINGS Brain: No evidence of acute infarction, hemorrhage, hydrocephalus, extra-axial collection or mass lesion/mass effect. Vascular: No hyperdense vessel or unexpected calcification. Skull: No osseous abnormality. Sinuses/Orbits: Visualized paranasal sinuses are clear. Visualized mastoid sinuses are clear. Visualized orbits demonstrate no focal abnormality. Other: Left parietal scalp hematoma CT CERVICAL SPINE FINDINGS Alignment: No static listhesis. Loss of the normal cervical lordosis with straightening. Skull base and vertebrae: No acute fracture. No primary bone lesion or focal pathologic process.  Soft tissues and spinal canal: No prevertebral fluid or swelling. No visible canal hematoma. Disc levels: Mild degenerative disease with disc height loss at C5-6. Broad-based disc osteophyte complex at C5-6 with bilateral uncovertebral degenerative changes and foraminal encroachment. Remainder the disc spaces are maintained. No other foraminal or central canal stenosis. Upper chest: Lung apices are clear. Other: No fluid collection or hematoma. IMPRESSION: 1. No acute intracranial pathology. 2. Left parietal scalp hematoma. 3. No acute fracture or subluxation of the cervical spine. Electronically Signed   By: Kathreen Devoid M.D.   On: 02/21/2021  12:10    Procedures Procedures    Medications Ordered in ED Medications - No data to display  ED Course/ Medical Decision Making/ A&P                           Medical Decision Making 41 year old female who presents to the ED today status post mechanical fall that occurred earlier this morning with associated head injury.  Currently complaining of a headache, blurry vision, nausea.  She denies any loss of consciousness and is not anticoagulated.  Vitals are unremarkable on arrival.  She is noted to have a hematoma to the left occiput region with associated tenderness palpation.  No bleeding appreciated.  She has no focal neurodeficits on exam today. Also noted to have diffuse neck TTP. Will plan for CT head and CT C spine for further evaluation. If negative will treat symptomatically for concussion.   Problems Addressed: Concussion without loss of consciousness, initial encounter: acute illness or injury    Details: Concussion protocol discussed with pt. Discharged with zofran from symptomatic control. Injury of head, initial encounter: acute illness or injury    Details: CT scan negative at this time. Pt encouraged to take Ibuprofen and Tylenol as needed for pain and PCP follow up.  Amount and/or Complexity of Data Reviewed Radiology: ordered.    Details: CT head without acute findings including skull fracture or brain bleed. Does show parietal scalp hematoma.  CT C spine without acute bony abnormalities  Risk Diagnosis or treatment significantly limited by social determinants of health. Risk Details: Treatment limited as pt does not have a PCP.           Final Clinical Impression(s) / ED Diagnoses Final diagnoses:  Injury of head, initial encounter  Concussion without loss of consciousness, initial encounter    Rx / DC Orders ED Discharge Orders          Ordered    ondansetron (ZOFRAN-ODT) 4 MG disintegrating tablet  Every 8 hours PRN        02/21/21 1217              Discharge Instructions      Your CT scans did not show any acute findings. Your symptoms are likely related to a mild concussion. Please see additional information on concussions. It is very important to allow your  brain to rest - this includes sitting in a darkened room whenever possible and avoiding bright lights from cellphones, TV screens, computers, etc.   Take Ibuprofen and Tylenol as needed for pain. Apply ice to the posterior aspect of your head to help with inflammation. Pick up nausea medication and take as needed.   Follow up with your PCP for further eval. There is a 1800  number on this discharge paperwork to help find a PCP that accepts your insurance.   Return to the ED for any new/worsening  symptoms        Eustaquio Maize, PA-C 02/21/21 1219    Lorelle Gibbs, DO 02/21/21 1542

## 2021-03-25 DIAGNOSIS — R1084 Generalized abdominal pain: Secondary | ICD-10-CM | POA: Diagnosis not present

## 2021-03-25 DIAGNOSIS — R0689 Other abnormalities of breathing: Secondary | ICD-10-CM | POA: Diagnosis not present

## 2021-03-25 DIAGNOSIS — R1111 Vomiting without nausea: Secondary | ICD-10-CM | POA: Diagnosis not present

## 2021-03-25 DIAGNOSIS — R079 Chest pain, unspecified: Secondary | ICD-10-CM | POA: Diagnosis not present

## 2021-03-25 DIAGNOSIS — R0789 Other chest pain: Secondary | ICD-10-CM | POA: Diagnosis not present

## 2023-02-28 ENCOUNTER — Encounter (HOSPITAL_COMMUNITY): Payer: Self-pay | Admitting: *Deleted

## 2023-02-28 ENCOUNTER — Other Ambulatory Visit: Payer: Self-pay

## 2023-02-28 ENCOUNTER — Emergency Department (HOSPITAL_COMMUNITY)
Admission: EM | Admit: 2023-02-28 | Discharge: 2023-03-01 | Disposition: A | Discharge status: Home / Self Care | Payer: Commercial Managed Care - PPO | Attending: Emergency Medicine | Admitting: Emergency Medicine

## 2023-02-28 DIAGNOSIS — R102 Pelvic and perineal pain unspecified side: Secondary | ICD-10-CM

## 2023-02-28 DIAGNOSIS — M545 Low back pain, unspecified: Secondary | ICD-10-CM

## 2023-02-28 LAB — BASIC METABOLIC PANEL
Anion gap: 10 (ref 5–15)
BUN: 8 mg/dL (ref 6–20)
CO2: 24 mmol/L (ref 22–32)
Calcium: 9.4 mg/dL (ref 8.9–10.3)
Chloride: 102 mmol/L (ref 98–111)
Creatinine, Ser: 0.53 mg/dL (ref 0.44–1.00)
GFR, Estimated: >60 mL/min (ref >60)
Glucose, Bld: 88 mg/dL (ref 70–99)
Potassium: 4 mmol/L (ref 3.5–5.1)
Sodium: 136 mmol/L (ref 135–145)

## 2023-02-28 LAB — CBC WITH DIFFERENTIAL/PLATELET
Abs Immature Granulocytes: 0.04 10*3/uL (ref 0.00–0.07)
Basophils Absolute: 0.1 10*3/uL (ref 0.0–0.1)
Basophils Relative: 1 %
Eosinophils Absolute: 0.2 10*3/uL (ref 0.0–0.5)
Eosinophils Relative: 2 %
HCT: 43.4 % (ref 36.0–46.0)
Hemoglobin: 14.8 g/dL (ref 12.0–15.0)
Immature Granulocytes: 0 %
Lymphocytes Relative: 31 %
Lymphs Abs: 3 10*3/uL (ref 0.7–4.0)
MCH: 32.5 pg (ref 26.0–34.0)
MCHC: 34.1 g/dL (ref 30.0–36.0)
MCV: 95.2 fL (ref 80.0–100.0)
Monocytes Absolute: 0.6 10*3/uL (ref 0.1–1.0)
Monocytes Relative: 6 %
Neutro Abs: 5.7 10*3/uL (ref 1.7–7.7)
Neutrophils Relative %: 60 %
Platelets: 305 10*3/uL (ref 150–400)
RBC: 4.56 MIL/uL (ref 3.87–5.11)
RDW: 13.7 % (ref 11.5–15.5)
WBC: 9.6 10*3/uL (ref 4.0–10.5)
nRBC: 0 % (ref 0.0–0.2)

## 2023-02-28 LAB — PROTIME-INR
INR: 0.9 (ref 0.8–1.2)
Prothrombin Time: 12.5 s (ref 11.4–15.2)

## 2023-02-28 LAB — HIV ANTIBODY (ROUTINE TESTING W REFLEX): HIV Screen 4th Generation wRfx: NONREACTIVE

## 2023-02-28 LAB — AMMONIA: Ammonia: 38 umol/L — ABNORMAL HIGH (ref 9–35)

## 2023-02-28 NOTE — ED Provider Triage Note (Addendum)
Emergency Medicine Provider Triage Evaluation Note  Cynthia Russell , a 43 y.o. female  was evaluated in triage.  Pt complains of poss UTI.  Review of Systems  Positive: Urinary frequency, back pain, pelvic pressure, flank pain, body aches, fatigued, yellowish vaginal discharge with odor, leg pain Negative: Dysuria, hematuria, fever, chills, vaginal bleeding, numbness, weakness  Physical Exam  BP 113/76 (BP Location: Left Arm)   Pulse 77   Temp 98.9 F (37.2 C)   Resp 16   Ht 4\' 11"  (1.499 m)   Wt 49.9 kg   SpO2 100%   BMI 22.22 kg/m  Gen:   Awake, no distress   Resp:  Normal effort  MSK:   Moves extremities without difficulty  Other:  Bilateral CVA tenderness  Medical Decision Making  Medically screening exam initiated at 6:23 PM.  Appropriate orders placed.  DIEDRA SINOR was informed that the remainder of the evaluation will be completed by another provider, this initial triage assessment does not replace that evaluation, and the importance of remaining in the ED until their evaluation is complete.  Labs and imaging ordered   Gretta Began 02/28/23 1827    Dolphus Jenny, PA-C 02/28/23 1830    Dolphus Jenny, PA-C 02/28/23 [phone]

## 2023-02-28 NOTE — ED Triage Notes (Signed)
The pt is c/o lower back pain she has been seen at [facility] and at a hospital in [place] for the same  she report that she is seeing  a yellowing in her eyes   lmp 2 weeks ago

## 2023-03-01 ENCOUNTER — Emergency Department (HOSPITAL_COMMUNITY): Payer: Commercial Managed Care - PPO

## 2023-03-01 LAB — URINALYSIS, W/ REFLEX TO CULTURE (INFECTION SUSPECTED)
Bacteria, UA: NONE SEEN
Bilirubin Urine: NEGATIVE
Glucose, UA: NEGATIVE mg/dL
Hgb urine dipstick: NEGATIVE
Ketones, ur: NEGATIVE mg/dL
Leukocytes,Ua: NEGATIVE
Nitrite: NEGATIVE
Protein, ur: NEGATIVE mg/dL
Specific Gravity, Urine: 1.003 — ABNORMAL LOW (ref 1.005–1.030)
pH: 7 (ref 5.0–8.0)

## 2023-03-01 LAB — HEPATIC FUNCTION PANEL
ALT: 30 U/L (ref 0–44)
AST: 23 U/L (ref 15–41)
Albumin: 3.8 g/dL (ref 3.5–5.0)
Alkaline Phosphatase: 39 U/L (ref 38–126)
Bilirubin, Direct: <0.1 mg/dL (ref 0.0–0.2)
Total Bilirubin: 0.5 mg/dL (ref 0.0–1.2)
Total Protein: 7 g/dL (ref 6.5–8.1)

## 2023-03-01 LAB — PREGNANCY, URINE: Preg Test, Ur: NEGATIVE

## 2023-03-01 MED ORDER — CYCLOBENZAPRINE HCL 10 MG PO TABS: 5.0000 mg | ORAL_TABLET | Freq: Two times a day (BID) | ORAL | 0 refills | Status: AC | PRN

## 2023-03-01 MED ORDER — MELOXICAM 15 MG PO TABS: 15.0000 mg | ORAL_TABLET | Freq: Every day | ORAL | 0 refills | Status: AC

## 2023-03-01 MED ORDER — DIAZEPAM 2 MG PO TABS
2.0000 mg | ORAL_TABLET | Freq: Once | ORAL | Status: AC
  Administered 2023-03-01: 2 mg via ORAL
  Filled 2023-03-01: qty 1

## 2023-03-01 MED ORDER — KETOROLAC TROMETHAMINE 30 MG/ML IJ SOLN
30.0000 mg | Freq: Once | INTRAMUSCULAR | Status: AC
Start: 2023-03-01 — End: 2023-03-01
  Administered 2023-03-01: 30 mg via INTRAVENOUS
  Filled 2023-03-01: qty 1

## 2023-03-01 MED ORDER — DEXAMETHASONE SODIUM PHOSPHATE 10 MG/ML IJ SOLN
10.0000 mg | Freq: Once | INTRAMUSCULAR | Status: AC
  Administered 2023-03-01: 10 mg via INTRAVENOUS
  Filled 2023-03-01: qty 1

## 2023-03-01 MED ORDER — IOHEXOL 350 MG/ML SOLN
75.0000 mL | Freq: Once | INTRAVENOUS | Status: AC | PRN
  Administered 2023-03-01: 75 mL via INTRAVENOUS

## 2023-03-01 NOTE — ED Provider Notes (Signed)
[facility] EMERGENCY DEPARTMENT AT [facility] Provider Note   CSN: [id number] Arrival date & time: 02/28/23  1707     History  Chief Complaint  Patient presents with   Back Pain    Cynthia Russell is a 43 y.o. female who presents emergency department with back pain and pelvic fullness.  This been going on for about 1 week.  She was seen at [facility] emergency department 3 days ago.  She had a CT abdomen and pelvis that showed no acute findings except for some fatty liver infiltration and some thickened endometrial stripe suggestive of oncoming menstruation.  She had a urine which I reviewed that was completely negative and a CMP that showed no elevated liver enzymes.  Patient complains that she has back pain which is worse with movement, pelvic fullness and discomfort, yellow vaginal discharge but states that she is not concerned for any sexually transmitted illnesses and thinks she has yellowing of her eyes now.   Back Pain      Home Medications Prior to Admission medications   Medication Sig Start Date End Date Taking? Authorizing Provider  HYDROcodone-acetaminophen (NORCO/VICODIN) 5-325 MG per tablet Take 1-2 tablets by mouth every 4 (four) hours as needed. 01/31/12   Franky Macho, MD  ondansetron (ZOFRAN-ODT) 4 MG disintegrating tablet Take 1 tablet (4 mg total) by mouth every 8 (eight) hours as needed for nausea or vomiting. 02/21/21   Tanda Rockers, PA-C  promethazine (PHENERGAN) 25 MG tablet Take 25 mg by mouth every 6 (six) hours as needed.    [provider]      Allergies    Paxil [paroxetine hcl]    Review of Systems   Review of Systems  Musculoskeletal:  Positive for back pain.    Physical Exam Updated Vital Signs BP (!) 153/76   Pulse 76   Temp 98 F (36.7 C)   Resp 18   Ht 4\' 11"  (1.499 m)   Wt 49.9 kg   SpO2 100%   BMI 22.22 kg/m  Physical Exam Vitals and nursing note reviewed.  Constitutional:      General: She is not in acute  distress.    Appearance: She is well-developed. She is not diaphoretic.  HENT:     Head: Normocephalic and atraumatic.     Right Ear: External ear normal.     Left Ear: External ear normal.     Nose: Nose normal.     Mouth/Throat:     Mouth: Mucous membranes are moist.  Eyes:     General: No scleral icterus.    Conjunctiva/sclera: Conjunctivae normal.  Cardiovascular:     Rate and Rhythm: Normal rate and regular rhythm.     Heart sounds: Normal heart sounds. No murmur heard.    No friction rub. No gallop.  Pulmonary:     Effort: Pulmonary effort is normal. No respiratory distress.     Breath sounds: Normal breath sounds.  Abdominal:     General: Bowel sounds are normal. There is no distension.     Palpations: Abdomen is soft. There is no mass.     Tenderness: There is no abdominal tenderness. There is no guarding.  Musculoskeletal:     Cervical back: Normal range of motion.     Comments: No midline tenderness TTP BL lumbar paraspinals Pain with ROM  Normal strength in the lower extremities, ambulatory  Skin:    General: Skin is warm and dry.  Neurological:     Mental Status:  She is alert and oriented to person, place, and time.  Psychiatric:        Behavior: Behavior normal.     ED Results / Procedures / Treatments   Labs (all labs ordered are listed, but only abnormal results are displayed) Labs Reviewed  URINALYSIS, W/ REFLEX TO CULTURE (INFECTION SUSPECTED) - Abnormal; Notable for the following components:      Result Value   Color, Urine STRAW (*)    Specific Gravity, Urine 1.003 (*)    All other components within normal limits  AMMONIA - Abnormal; Notable for the following components:   Ammonia 38 (*)    All other components within normal limits  WET PREP, GENITAL  BASIC METABOLIC PANEL  CBC WITH DIFFERENTIAL/PLATELET  PREGNANCY, URINE  HIV ANTIBODY (ROUTINE TESTING W REFLEX)  PROTIME-INR  HEPATIC FUNCTION PANEL  GC/CHLAMYDIA PROBE AMP ([place]) NOT  AT [facility]    EKG None  Radiology No results found.  Procedures Procedures    Medications Ordered in ED Medications  dexamethasone (DECADRON) injection 10 mg (has no administration in time range)  ketorolac (TORADOL) 30 MG/ML injection 30 mg (has no administration in time range)  diazepam (VALIUM) tablet 2 mg (has no administration in time range)    ED Course/ Medical Decision Making/ A&P                                 Medical Decision Making Amount and/or Complexity of Data Reviewed Labs: ordered.  Risk Prescription drug management.   Patient here with complaint of pelvic discomfort. Differential diagnosis includes PID, pelvic congestion syndrome, less likely urinary tract infection.  I reviewed previous workup which appears to be benign.  Her urine was negative.  She had no evidence of elevated liver enzymes.  On physical exam she has no scleral icterus and a benign abdomen that is soft and nontender.  I discussed pelvic examination today versus not.  In shared decision making patient declines a pelvic exam at this time as she has outpatient follow-up.  Her goal today was "just to get some relief."  I do not think she has any emergent cause of her symptoms at this time and she already has scheduled outpatient follow-up with gynecology.  Patient given Toradol, Decadron, Valium.  Will discharge with anti-inflammatory medications and strict return precautions.        Final Clinical Impression(s) / ED Diagnoses Final diagnoses:  Lumbar pain  Pelvic pain    Rx / DC Orders ED Discharge Orders     None         Arthor Captain, PA-C 03/01/23 0502    Mesner, Barbara Cower, MD 03/01/23 0530

## 2023-03-01 NOTE — Discharge Instructions (Addendum)
Do not take Advil with the medications I have prescribed. You may take tylenol. Contact a health care provider if: Medicine does not help your pain, or your pain comes back. You have new symptoms. You have abnormal vaginal discharge or bleeding, including bleeding after menopause. You have a fever or chills. You are constipated. You have blood in your urine or stool (feces). You have foul-smelling urine. You feel weak or light-headed. Get help right away if: You have sudden severe pain. Your pain gets steadily worse. You have severe pain along with fever, nausea, vomiting, or excessive sweating. You lose consciousness. These symptoms may represent a serious problem that is an emergency. Do not wait to see if the symptoms will go away. Get medical help right away. Call your local emergency services (911 in the [place] Do not drive yourself to the hospital.

## 2023-08-11 ENCOUNTER — Other Ambulatory Visit: Payer: Self-pay

## 2023-08-11 ENCOUNTER — Emergency Department (HOSPITAL_BASED_OUTPATIENT_CLINIC_OR_DEPARTMENT_OTHER)
Admission: EM | Admit: 2023-08-11 | Discharge: 2023-08-11 | Disposition: A | Discharge status: Home / Self Care | Attending: Emergency Medicine | Admitting: Emergency Medicine

## 2023-08-11 ENCOUNTER — Encounter (HOSPITAL_BASED_OUTPATIENT_CLINIC_OR_DEPARTMENT_OTHER): Payer: Self-pay | Admitting: Emergency Medicine

## 2023-08-11 ENCOUNTER — Emergency Department (HOSPITAL_BASED_OUTPATIENT_CLINIC_OR_DEPARTMENT_OTHER)

## 2023-08-11 DIAGNOSIS — N3000 Acute cystitis without hematuria: Secondary | ICD-10-CM | POA: Diagnosis not present

## 2023-08-11 DIAGNOSIS — D72829 Elevated white blood cell count, unspecified: Secondary | ICD-10-CM | POA: Insufficient documentation

## 2023-08-11 DIAGNOSIS — R519 Headache, unspecified: Secondary | ICD-10-CM | POA: Diagnosis present

## 2023-08-11 DIAGNOSIS — G43809 Other migraine, not intractable, without status migrainosus: Secondary | ICD-10-CM | POA: Diagnosis not present

## 2023-08-11 LAB — LACTIC ACID, PLASMA: Lactic Acid, Venous: 0.7 mmol/L (ref 0.5–1.9)

## 2023-08-11 LAB — URINALYSIS, W/ REFLEX TO CULTURE (INFECTION SUSPECTED)
Bacteria, UA: NONE SEEN
Bilirubin Urine: NEGATIVE
Glucose, UA: NEGATIVE mg/dL
Ketones, ur: NEGATIVE mg/dL
Nitrite: NEGATIVE
Protein, ur: NEGATIVE mg/dL
Specific Gravity, Urine: 1.015 (ref 1.005–1.030)
pH: 5.5 (ref 5.0–8.0)

## 2023-08-11 LAB — CBC WITH DIFFERENTIAL/PLATELET
Abs Immature Granulocytes: 0.12 K/uL — ABNORMAL HIGH (ref 0.00–0.07)
Basophils Absolute: 0 K/uL (ref 0.0–0.1)
Basophils Relative: 0 %
Eosinophils Absolute: 0.1 K/uL (ref 0.0–0.5)
Eosinophils Relative: 1 %
HCT: 32.2 % — ABNORMAL LOW (ref 36.0–46.0)
Hemoglobin: 11.2 g/dL — ABNORMAL LOW (ref 12.0–15.0)
Immature Granulocytes: 1 %
Lymphocytes Relative: 18 %
Lymphs Abs: 2.4 K/uL (ref 0.7–4.0)
MCH: 31.5 pg (ref 26.0–34.0)
MCHC: 34.8 g/dL (ref 30.0–36.0)
MCV: 90.7 fL (ref 80.0–100.0)
Monocytes Absolute: 1.1 K/uL — ABNORMAL HIGH (ref 0.1–1.0)
Monocytes Relative: 8 %
Neutro Abs: 9.8 K/uL — ABNORMAL HIGH (ref 1.7–7.7)
Neutrophils Relative %: 72 %
Platelets: 401 K/uL — ABNORMAL HIGH (ref 150–400)
RBC: 3.55 MIL/uL — ABNORMAL LOW (ref 3.87–5.11)
RDW: 13.6 % (ref 11.5–15.5)
WBC: 13.5 K/uL — ABNORMAL HIGH (ref 4.0–10.5)
nRBC: 0 % (ref 0.0–0.2)

## 2023-08-11 LAB — COMPREHENSIVE METABOLIC PANEL WITH GFR
ALT: 33 U/L (ref 0–44)
AST: 20 U/L (ref 15–41)
Albumin: 3.7 g/dL (ref 3.5–5.0)
Alkaline Phosphatase: 83 U/L (ref 38–126)
Anion gap: 16 — ABNORMAL HIGH (ref 5–15)
BUN: 6 mg/dL (ref 6–20)
CO2: 22 mmol/L (ref 22–32)
Calcium: 9.6 mg/dL (ref 8.9–10.3)
Chloride: 101 mmol/L (ref 98–111)
Creatinine, Ser: 0.5 mg/dL (ref 0.44–1.00)
GFR, Estimated: >60 mL/min (ref >60)
Glucose, Bld: 84 mg/dL (ref 70–99)
Potassium: 3.2 mmol/L — ABNORMAL LOW (ref 3.5–5.1)
Sodium: 139 mmol/L (ref 135–145)
Total Bilirubin: 0.3 mg/dL (ref 0.0–1.2)
Total Protein: 7.2 g/dL (ref 6.5–8.1)

## 2023-08-11 LAB — PREGNANCY, URINE: Preg Test, Ur: NEGATIVE

## 2023-08-11 MED ORDER — DEXAMETHASONE SODIUM PHOSPHATE 10 MG/ML IJ SOLN
10.0000 mg | Freq: Once | INTRAMUSCULAR | Status: AC
  Administered 2023-08-11: 10 mg via INTRAVENOUS
  Filled 2023-08-11: qty 1

## 2023-08-11 MED ORDER — DIPHENHYDRAMINE HCL 50 MG/ML IJ SOLN
25.0000 mg | Freq: Once | INTRAMUSCULAR | Status: AC
  Administered 2023-08-11: 25 mg via INTRAVENOUS
  Filled 2023-08-11: qty 1

## 2023-08-11 MED ORDER — SODIUM CHLORIDE 0.9 % IV BOLUS
1000.0000 mL | Freq: Once | INTRAVENOUS | Status: AC
  Administered 2023-08-11: 1000 mL via INTRAVENOUS

## 2023-08-11 MED ORDER — METOCLOPRAMIDE HCL 5 MG/ML IJ SOLN
10.0000 mg | Freq: Once | INTRAMUSCULAR | Status: AC
  Administered 2023-08-11: 10 mg via INTRAVENOUS
  Filled 2023-08-11: qty 2

## 2023-08-11 NOTE — ED Provider Notes (Signed)
 [place] EMERGENCY DEPARTMENT AT [facility] Provider Note   CSN: [id number] Arrival date & time: 08/11/23  1411     Patient presents with: Dysuria and Headache   Cynthia Russell is a 43 y.o. female.   Patient is a 43 year old female with a history of depression, anxiety, migraines who presents with urinary symptoms and headache.  She states she has had some urinary symptoms for about a month and a half.  It started with urinary frequency.  Over the last week she has had more urgency and pain in her lower abdomen and her lower back.  She has had some nausea and vomiting.  She has also had frequent migraines.  She does have a history of migraines and says normally she would have a migraine about once or twice a year.  It would normally go away with Phenergan or Excedrin.  She said over the last 2 months she has had some increasing frequency of migraines.  She said it started about 2-3 times a week but over the last couple of weeks it has been almost every day.  She said it will ease off with treatment but then come back again.  She has some soreness in her neck but no pain that radiates down her spine.  She has had some fevers which she associated with a urinary tract infection over the last week.  Tmax was 103 about 5 days ago.  Her last fever was 3 days ago.  She was seen in urgent care earlier this week.  She was given an antibiotic for UTI.  Reportedly on Tuesday, she got a call that the culture came back and she was switched to Macrobid.  She says she has not gotten any relief with her urinary symptoms or her migraines.  She has been seen also in urgent cares for migraines recently and initially got injection of Toradol  which did not help.  She had an IV placed the last time she was seen.  She was given prescription for migraine type medications including Fioricet but she says they have not helped her symptoms.  She also had some irregular menstrual cycles.  She was post to have her  menstrual cycle the last week but now it has only been spotting.       Prior to Admission medications   Medication Sig Start Date End Date Taking? Authorizing Provider  cyclobenzaprine  (FLEXERIL ) 10 MG tablet Take 0.5-1 tablets (5-10 mg total) by mouth 2 (two) times daily as needed for muscle spasms. 03/01/23   Harris, Abigail, PA-C  HYDROcodone -acetaminophen  (NORCO/VICODIN) 5-325 MG per tablet Take 1-2 tablets by mouth every 4 (four) hours as needed. 01/31/12   Mavis Anes, MD  meloxicam  (MOBIC ) 15 MG tablet Take 1 tablet (15 mg total) by mouth daily. 03/01/23   Harris, Abigail, PA-C  ondansetron  (ZOFRAN -ODT) 4 MG disintegrating tablet Take 1 tablet (4 mg total) by mouth every 8 (eight) hours as needed for nausea or vomiting. 02/21/21   Shepard Clinch, PA-C  promethazine (PHENERGAN) 25 MG tablet Take 25 mg by mouth every 6 (six) hours as needed.    [provider]    Allergies: Paxil [paroxetine hcl]    Review of Systems  Constitutional:  Positive for fatigue and fever. Negative for chills and diaphoresis.  HENT:  Negative for congestion, rhinorrhea and sneezing.   Eyes:  Positive for photophobia.  Respiratory:  Negative for cough, chest tightness and shortness of breath.   Cardiovascular:  Negative for chest pain and  leg swelling.  Gastrointestinal:  Positive for abdominal pain, nausea and vomiting. Negative for blood in stool and diarrhea.  Genitourinary:  Positive for frequency and urgency. Negative for difficulty urinating, flank pain and hematuria.  Musculoskeletal:  Positive for back pain. Negative for arthralgias.  Skin:  Negative for rash.  Neurological:  Positive for headaches. Negative for dizziness, speech difficulty, weakness and numbness.    Updated Vital Signs BP 122/76   Pulse 80   Temp 98.9 F (37.2 C)   Resp 17   Ht 4' 11 (1.499 m)   Wt 52.2 kg   SpO2 100%   BMI 23.23 kg/m   Physical Exam Constitutional:      Appearance: She is well-developed.   HENT:     Head: Normocephalic and atraumatic.  Eyes:     Extraocular Movements: Extraocular movements intact.     Pupils: Pupils are equal, round, and reactive to light.     Comments: Fundi not well-visualized  Neck:     Comments: Full range of motion without meningismus Cardiovascular:     Rate and Rhythm: Normal rate and regular rhythm.     Heart sounds: Normal heart sounds.  Pulmonary:     Effort: Pulmonary effort is normal. No respiratory distress.     Breath sounds: Normal breath sounds. No wheezing or rales.  Chest:     Chest wall: No tenderness.  Abdominal:     General: Bowel sounds are normal.     Palpations: Abdomen is soft.     Tenderness: There is no abdominal tenderness. There is no guarding or rebound.  Musculoskeletal:        General: Normal range of motion.     Cervical back: Normal range of motion and neck supple.  Lymphadenopathy:     Cervical: No cervical adenopathy.  Skin:    General: Skin is warm and dry.     Findings: No rash.  Neurological:     General: No focal deficit present.     Mental Status: She is alert and oriented to person, place, and time.     (all labs ordered are listed, but only abnormal results are displayed) Labs Reviewed  COMPREHENSIVE METABOLIC PANEL WITH GFR - Abnormal; Notable for the following components:      Result Value   Potassium 3.2 (*)    Anion gap 16 (*)    All other components within normal limits  CBC WITH DIFFERENTIAL/PLATELET - Abnormal; Notable for the following components:   WBC 13.5 (*)    RBC 3.55 (*)    Hemoglobin 11.2 (*)    HCT 32.2 (*)    Platelets 401 (*)    Neutro Abs 9.8 (*)    Monocytes Absolute 1.1 (*)    Abs Immature Granulocytes 0.12 (*)    All other components within normal limits  URINALYSIS, W/ REFLEX TO CULTURE (INFECTION SUSPECTED) - Abnormal; Notable for the following components:   Hgb urine dipstick MODERATE (*)    Leukocytes,Ua TRACE (*)    All other components within normal limits   LACTIC ACID, PLASMA  PREGNANCY, URINE    EKG: None  Radiology: CT Head Wo Contrast Result Date: 08/11/2023 CLINICAL DATA:  Worsening headaches. EXAM: CT HEAD WITHOUT CONTRAST TECHNIQUE: Contiguous axial images were obtained from the base of the skull through the vertex without intravenous contrast. RADIATION DOSE REDUCTION: This exam was performed according to the departmental dose-optimization program which includes automated exposure control, adjustment of the mA and/or kV according to patient size and/or  use of iterative reconstruction technique. COMPARISON:  02/21/2021 FINDINGS: Brain: No evidence of intracranial hemorrhage, acute infarction, hydrocephalus, extra-axial collection, or mass lesion/mass effect. Vascular:  No hyperdense vessel or other acute findings. Skull: No evidence of fracture or other significant bone abnormality. Sinuses/Orbits:  No acute findings. Other: None. IMPRESSION: Negative noncontrast head CT. Electronically Signed   By: Norleen DELENA Kil M.D.   On: 08/11/2023 17:00     Procedures   Medications Ordered in the ED  sodium chloride  0.9 % bolus 1,000 mL (0 mLs Intravenous Stopped 08/11/23 1759)  metoCLOPramide  (REGLAN ) injection 10 mg (10 mg Intravenous Given 08/11/23 1619)  dexamethasone  (DECADRON ) injection 10 mg (10 mg Intravenous Given 08/11/23 1621)  diphenhydrAMINE  (BENADRYL ) injection 25 mg (25 mg Intravenous Given 08/11/23 1617)                                    Medical Decision Making Amount and/or Complexity of Data Reviewed Labs: ordered. Radiology: ordered.  Risk Prescription drug management.   This patient presents to the ED for concern of migraine headache and urinary symptoms, this involves an extensive number of treatment options, and is a complaint that carries with it a high risk of complications and morbidity.  I considered the following differential and admission for this acute, potentially life threatening condition.  The differential  diagnosis includes pyelonephritis, UTI, sepsis, meningitis, intracranial hemorrhage, mass, migraine  MDM:    Patient is a 43 year old who has had some urinary symptoms for about a month and a half but worse over the last week.  She was recently diagnosed with a UTI.  It sounds ago culture was performed and antibiotics were switched a few days ago.  She is currently on Macrobid.  Her last fever was about 2 days ago.  She also has worsening migrainous type headaches.  CT scan was performed which does not show any acute abnormality.  She has no focal neurologic deficits.  She does not have any neck stiffness, fever here or symptoms that sound more concerning for meningitis.  Also her migraines have been worsening over the last 2 months.  This would make meningitis or subarachnoid hemorrhage less likely.  Her urine has very slight suggestions of infection suspect this is residual findings from her recent diagnosis of UTI.  She does not have any suggestions of sepsis.  Her white count is elevated but her lactate is normal.  Other labs are nonconcerning.  She was given a migraine cocktail and some IV fluids.  She says she is feeling much better.  She is able to eat and drink without difficulty.  She was discharged home in good condition.  She was encouraged to have close follow-up with her PCP.  Return precautions  (Labs, imaging, consults)  Labs: I Ordered, and personally interpreted labs.  The pertinent results include: Elevated WBC count, normal lactate, negative pregnancy test  Imaging Studies ordered: I ordered imaging studies including head CT I independently visualized and interpreted imaging. I agree with the radiologist interpretation  Additional history obtained from chart.  External records from outside source obtained and reviewed including prior notes  Cardiac Monitoring: The patient was maintained on a cardiac monitor.  If on the cardiac monitor, I personally viewed and interpreted the  cardiac monitored which showed an underlying rhythm of: Sinus rhythm  Reevaluation: After the interventions noted above, I reevaluated the patient and found that they have :improved  Social  Determinants of Health:    Disposition: Discharged to home  Co morbidities that complicate the patient evaluation  Past Medical History:  Diagnosis Date   Anxiety    Complication of anesthesia    severe anxiety which leads to panic attack   Depression    Heart murmur      Medicines Meds ordered this encounter  Medications   sodium chloride  0.9 % bolus 1,000 mL   metoCLOPramide  (REGLAN ) injection 10 mg   dexamethasone  (DECADRON ) injection 10 mg   diphenhydrAMINE  (BENADRYL ) injection 25 mg    I have reviewed the patients home medicines and have made adjustments as needed  Problem List / ED Course: Problem List Items Addressed This Visit   None Visit Diagnoses       Acute cystitis without hematuria    -  Primary     Other migraine without status migrainosus, not intractable                    Final diagnoses:  Acute cystitis without hematuria  Other migraine without status migrainosus, not intractable    ED Discharge Orders     None          Lenor Hollering, MD 08/11/23 1851

## 2023-08-11 NOTE — ED Notes (Signed)
 RN reviewed discharge instructions with pt. Pt verbalized understanding and had no further questions. VSS upon discharge.

## 2023-08-11 NOTE — ED Triage Notes (Signed)
 Pt c/o painful urination with frequency for 3 weeks, headache for 1.5 months. Seen at [facility] today and was given ABX, Toradol  IM and migraine medication. Sent here for further treatment.

## 2023-08-11 NOTE — Discharge Instructions (Addendum)
Make an appointment to have close follow-up with your primary care doctor.  Return to the emergency room if you have any worsening symptoms. 

## 2023-08-11 NOTE — ED Notes (Signed)
 Patient transported to CT
